# Patient Record
Sex: Female | Born: 1965 | Race: White | Hispanic: No | Marital: Married | Smoking: Former smoker
Health system: Southern US, Community
[De-identification: ages and names within clinical notes are randomized; demographics above are authoritative.]

## PROBLEM LIST (undated history)

## (undated) HISTORY — PX: WISDOM TOOTH EXTRACTION: SHX21

---

## 2003-11-24 ENCOUNTER — Other Ambulatory Visit: Admission: RE | Admit: 2003-11-24 | Discharge: 2003-11-24 | Payer: Self-pay | Admitting: Obstetrics and Gynecology

## 2005-01-03 ENCOUNTER — Ambulatory Visit (HOSPITAL_COMMUNITY): Admission: RE | Admit: 2005-01-03 | Discharge: 2005-01-03 | Payer: Self-pay | Admitting: Family Medicine

## 2005-02-03 ENCOUNTER — Other Ambulatory Visit: Admission: RE | Admit: 2005-02-03 | Discharge: 2005-02-03 | Payer: Self-pay | Admitting: Obstetrics and Gynecology

## 2005-09-05 ENCOUNTER — Encounter: Admission: RE | Admit: 2005-09-05 | Discharge: 2005-11-13 | Payer: Self-pay | Admitting: Family Medicine

## 2006-01-11 ENCOUNTER — Ambulatory Visit (HOSPITAL_COMMUNITY): Admission: RE | Admit: 2006-01-11 | Discharge: 2006-01-11 | Payer: Self-pay | Admitting: Obstetrics and Gynecology

## 2006-12-13 ENCOUNTER — Ambulatory Visit (HOSPITAL_COMMUNITY): Admission: RE | Admit: 2006-12-13 | Discharge: 2006-12-13 | Payer: Self-pay | Admitting: Family Medicine

## 2007-11-05 ENCOUNTER — Ambulatory Visit (HOSPITAL_COMMUNITY): Admission: RE | Admit: 2007-11-05 | Discharge: 2007-11-05 | Payer: Self-pay | Admitting: Obstetrics and Gynecology

## 2008-11-02 ENCOUNTER — Encounter: Admission: RE | Admit: 2008-11-02 | Discharge: 2008-12-25 | Payer: Self-pay | Admitting: Family Medicine

## 2008-11-26 ENCOUNTER — Ambulatory Visit (HOSPITAL_COMMUNITY): Admission: RE | Admit: 2008-11-26 | Discharge: 2008-11-26 | Payer: Self-pay | Admitting: Family Medicine

## 2009-11-29 ENCOUNTER — Ambulatory Visit (HOSPITAL_COMMUNITY): Admission: RE | Admit: 2009-11-29 | Discharge: 2009-11-29 | Payer: Self-pay | Admitting: Family Medicine

## 2010-01-18 ENCOUNTER — Encounter: Admission: RE | Admit: 2010-01-18 | Discharge: 2010-01-18 | Payer: Self-pay | Admitting: Surgery

## 2010-10-03 ENCOUNTER — Other Ambulatory Visit: Payer: Self-pay | Admitting: Endocrinology

## 2010-10-03 DIAGNOSIS — E041 Nontoxic single thyroid nodule: Secondary | ICD-10-CM

## 2010-10-08 IMAGING — US US SOFT TISSUE HEAD/NECK
1 series · 14 of 25 positions shown · non-contrast
Comparison: None.

CLINICAL DATA: Nodule

THYROID ULTRASOUND
TECHNIQUE: Ultrasound examination of the thyroid gland and adjacent
soft tissues was performed.

[Series 1: us soft tissue head/neck · 0.05mm/px · 14 of 40 slices shown]
[im 1/40]
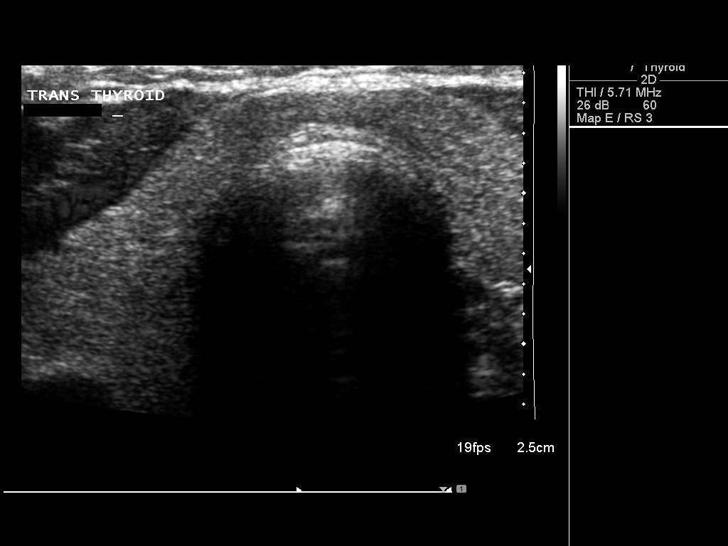
[im 4/40]
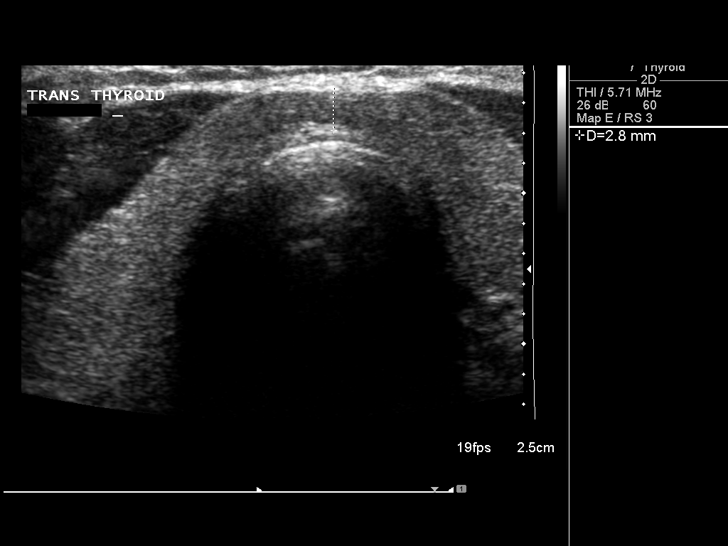
[im 7/40]
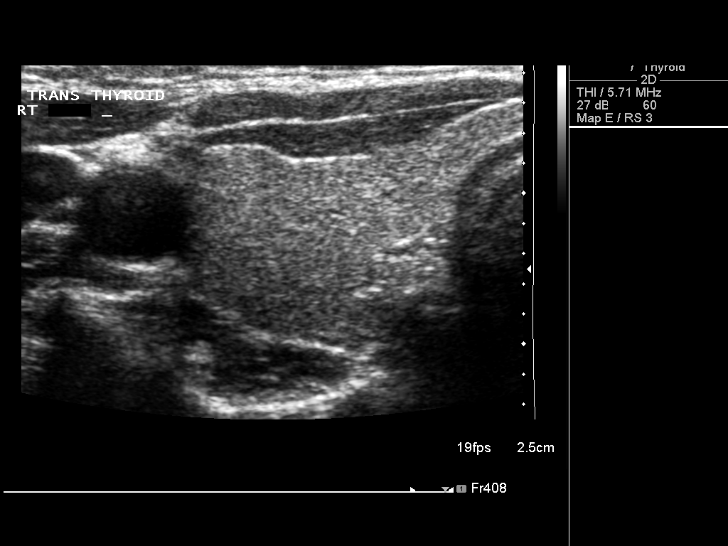
[im 10/40]
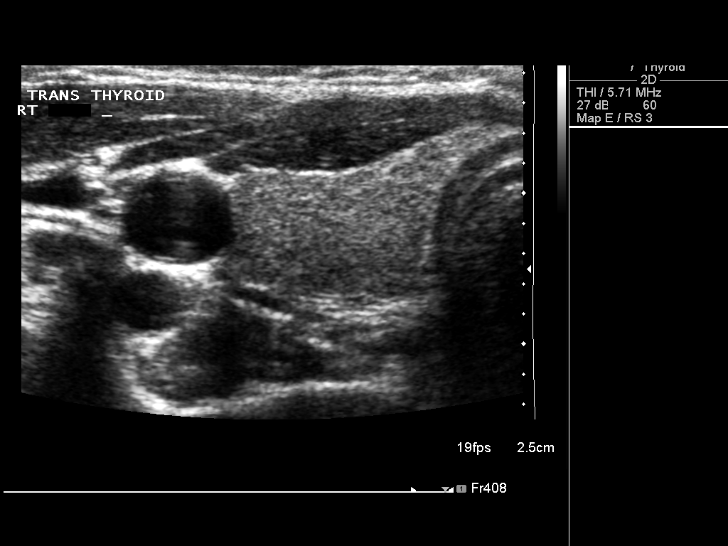
[im 14/40]
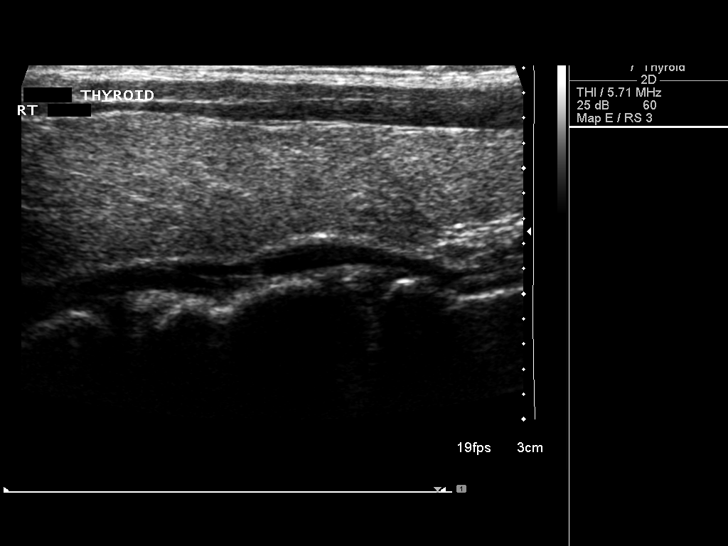
[im 15/40]
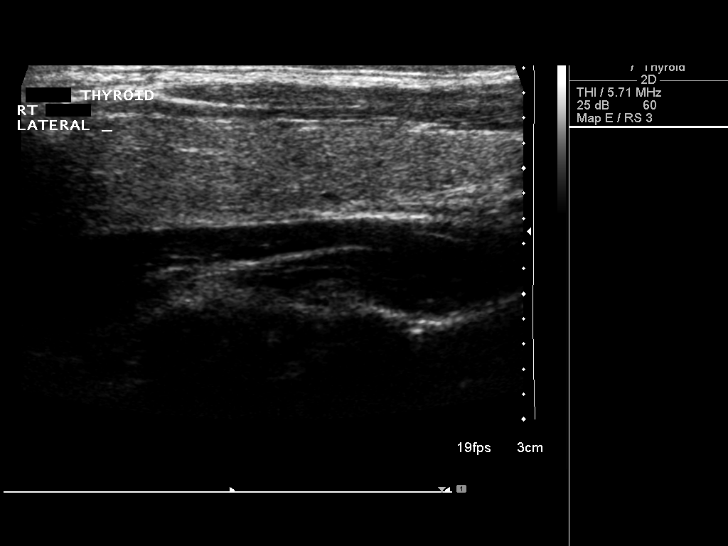
[im 18/40]
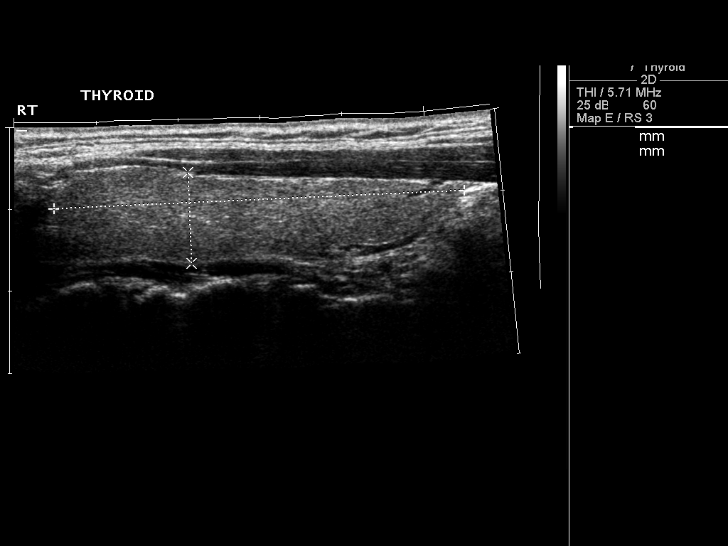
[im 22/40]
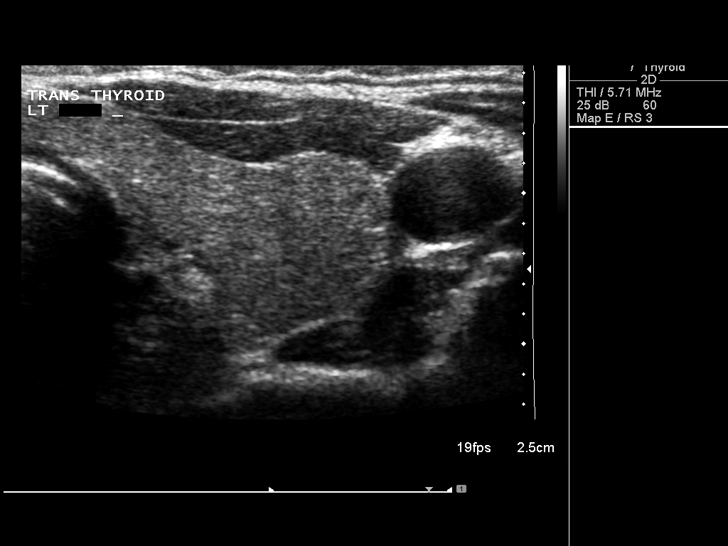
[im 25/40]
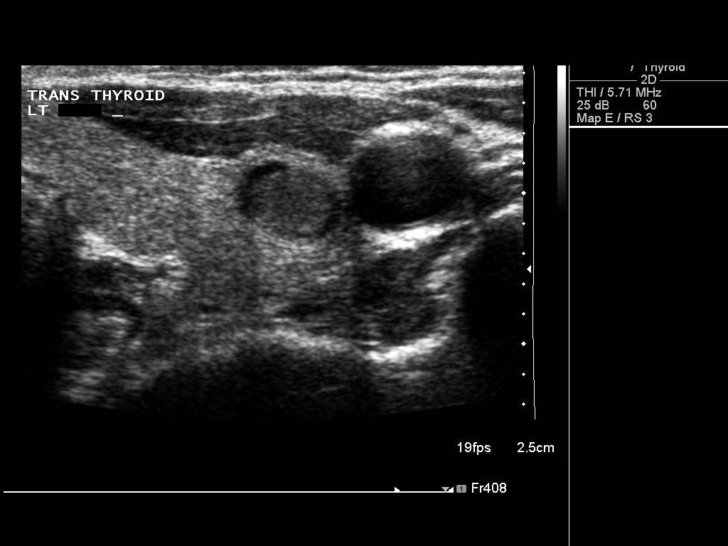
[im 27/40]
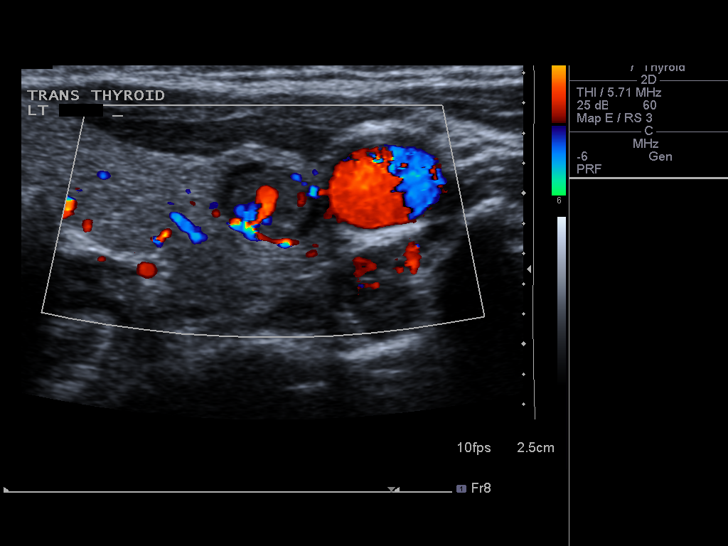
[im 30/40]
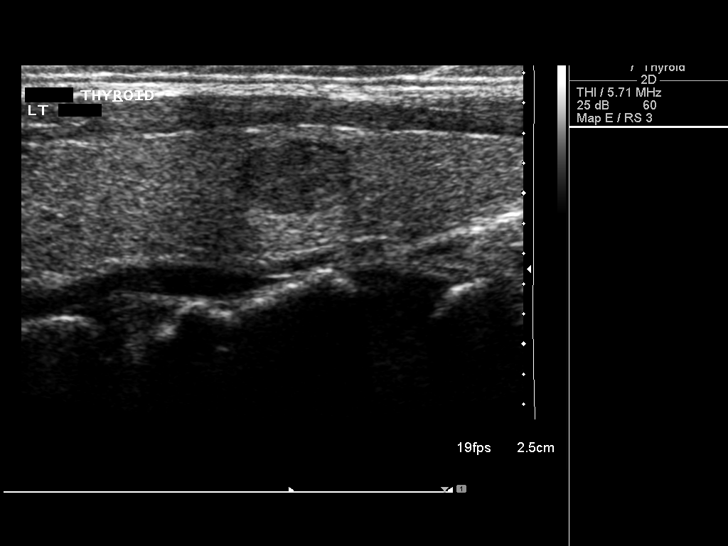
[im 33/40]
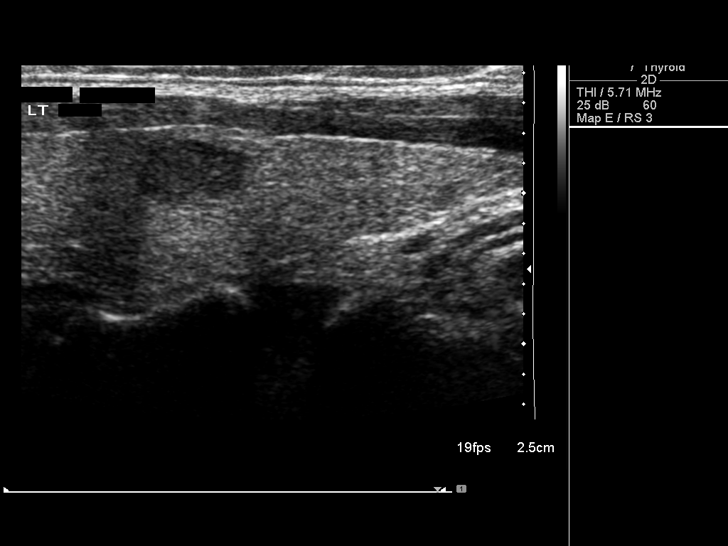
[im 36/40]
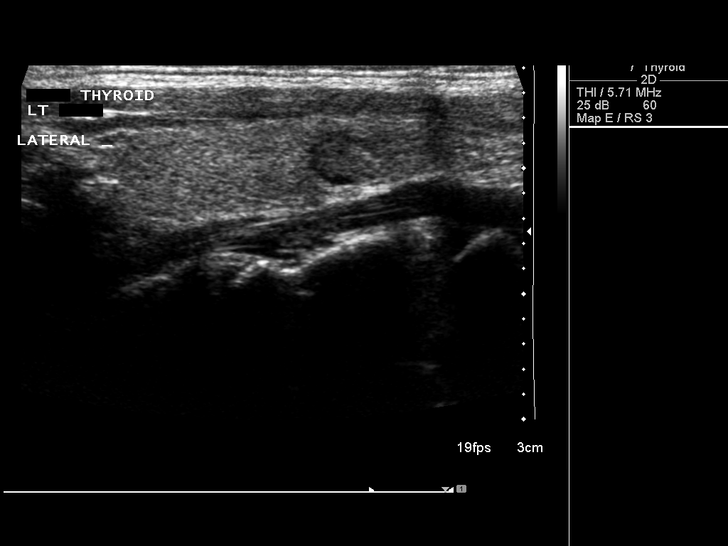
[im 40/40]
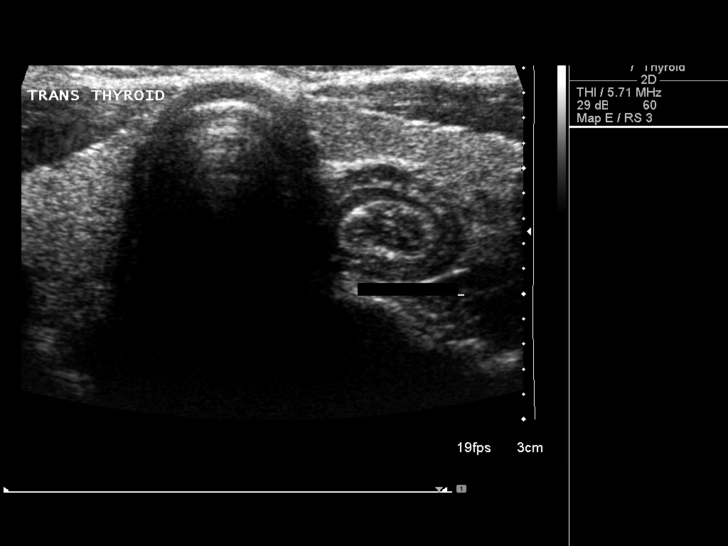

[14 of 25 positions shown; findings below may reference images not displayed]

FINDINGS: Right thyroid lobe:  5.0 x 1.1 x 1.8 cm.
Left thyroid lobe:  4.6 x 1.2 x 1.8 cm.
Isthmus:  3 mm in thickness.

Focal nodules:  Thyroid echotexture is homogeneous.  A single
nodule is noted in the interpolar region of the left lobe measuring
7 x 5 x 8 mm.

Lymphadenopathy:  None visualized.
IMPRESSION: 8 mm nodule in the left thyroid gland.  Follow-up ultrasound in 6
months is recommended to ensure stability.  Biopsy is not indicated
at this time.

## 2010-10-26 ENCOUNTER — Other Ambulatory Visit: Payer: Self-pay | Admitting: Internal Medicine

## 2010-10-26 DIAGNOSIS — E041 Nontoxic single thyroid nodule: Secondary | ICD-10-CM

## 2011-03-27 ENCOUNTER — Ambulatory Visit
Admission: RE | Admit: 2011-03-27 | Discharge: 2011-03-27 | Disposition: A | Discharge status: Home / Self Care | Payer: Managed Care, Other (non HMO) | Source: Ambulatory Visit | Attending: Endocrinology | Admitting: Endocrinology

## 2011-03-27 DIAGNOSIS — E041 Nontoxic single thyroid nodule: Secondary | ICD-10-CM

## 2011-04-06 ENCOUNTER — Other Ambulatory Visit (HOSPITAL_COMMUNITY): Payer: Self-pay | Admitting: Obstetrics and Gynecology

## 2011-04-06 DIAGNOSIS — Z1231 Encounter for screening mammogram for malignant neoplasm of breast: Secondary | ICD-10-CM

## 2011-05-11 ENCOUNTER — Ambulatory Visit (HOSPITAL_COMMUNITY): Payer: Managed Care, Other (non HMO)

## 2011-11-03 ENCOUNTER — Ambulatory Visit (HOSPITAL_COMMUNITY)
Admission: RE | Admit: 2011-11-03 | Discharge: 2011-11-03 | Disposition: A | Discharge status: Home / Self Care | Payer: BC Managed Care – PPO | Source: Ambulatory Visit | Attending: Obstetrics and Gynecology | Admitting: Obstetrics and Gynecology

## 2011-11-03 DIAGNOSIS — Z1231 Encounter for screening mammogram for malignant neoplasm of breast: Secondary | ICD-10-CM | POA: Insufficient documentation

## 2012-04-17 HISTORY — PX: GUM SURGERY: SHX658

## 2012-06-24 ENCOUNTER — Other Ambulatory Visit: Payer: Self-pay | Admitting: Endocrinology

## 2012-06-24 DIAGNOSIS — E041 Nontoxic single thyroid nodule: Secondary | ICD-10-CM

## 2012-07-01 ENCOUNTER — Other Ambulatory Visit: Payer: BC Managed Care – PPO

## 2012-07-03 ENCOUNTER — Ambulatory Visit
Admission: RE | Admit: 2012-07-03 | Discharge: 2012-07-03 | Disposition: A | Discharge status: Home / Self Care | Payer: Self-pay | Source: Ambulatory Visit | Attending: Endocrinology | Admitting: Endocrinology

## 2013-01-01 ENCOUNTER — Other Ambulatory Visit (HOSPITAL_COMMUNITY): Payer: Self-pay | Admitting: Obstetrics and Gynecology

## 2013-01-01 DIAGNOSIS — Z1231 Encounter for screening mammogram for malignant neoplasm of breast: Secondary | ICD-10-CM

## 2013-01-21 ENCOUNTER — Ambulatory Visit (HOSPITAL_COMMUNITY)
Admission: RE | Admit: 2013-01-21 | Discharge: 2013-01-21 | Disposition: A | Discharge status: Home / Self Care | Payer: BC Managed Care – PPO | Source: Ambulatory Visit | Attending: Obstetrics and Gynecology | Admitting: Obstetrics and Gynecology

## 2013-01-21 ENCOUNTER — Other Ambulatory Visit (HOSPITAL_COMMUNITY): Payer: Self-pay | Admitting: Obstetrics and Gynecology

## 2013-01-21 DIAGNOSIS — Z1231 Encounter for screening mammogram for malignant neoplasm of breast: Secondary | ICD-10-CM

## 2013-07-30 ENCOUNTER — Other Ambulatory Visit: Payer: Self-pay | Admitting: Endocrinology

## 2013-07-30 DIAGNOSIS — E049 Nontoxic goiter, unspecified: Secondary | ICD-10-CM

## 2013-07-30 DIAGNOSIS — E041 Nontoxic single thyroid nodule: Secondary | ICD-10-CM

## 2013-07-31 ENCOUNTER — Ambulatory Visit
Admission: RE | Admit: 2013-07-31 | Discharge: 2013-07-31 | Disposition: A | Discharge status: Home / Self Care | Payer: BC Managed Care – PPO | Source: Ambulatory Visit | Attending: Endocrinology | Admitting: Endocrinology

## 2013-07-31 DIAGNOSIS — E049 Nontoxic goiter, unspecified: Secondary | ICD-10-CM

## 2013-08-01 ENCOUNTER — Other Ambulatory Visit: Payer: BC Managed Care – PPO

## 2013-10-16 ENCOUNTER — Other Ambulatory Visit: Payer: Self-pay | Admitting: Endocrinology

## 2013-10-16 DIAGNOSIS — E049 Nontoxic goiter, unspecified: Secondary | ICD-10-CM

## 2014-07-27 ENCOUNTER — Other Ambulatory Visit: Payer: BC Managed Care – PPO

## 2014-07-28 ENCOUNTER — Ambulatory Visit
Admission: RE | Admit: 2014-07-28 | Discharge: 2014-07-28 | Disposition: A | Discharge status: Home / Self Care | Payer: BLUE CROSS/BLUE SHIELD | Source: Ambulatory Visit | Attending: Endocrinology | Admitting: Endocrinology

## 2014-07-28 DIAGNOSIS — E049 Nontoxic goiter, unspecified: Secondary | ICD-10-CM

## 2014-10-01 ENCOUNTER — Ambulatory Visit (INDEPENDENT_AMBULATORY_CARE_PROVIDER_SITE_OTHER): Payer: BLUE CROSS/BLUE SHIELD

## 2014-10-01 ENCOUNTER — Ambulatory Visit (INDEPENDENT_AMBULATORY_CARE_PROVIDER_SITE_OTHER): Payer: BLUE CROSS/BLUE SHIELD | Admitting: Podiatry

## 2014-10-01 DIAGNOSIS — G5762 Lesion of plantar nerve, left lower limb: Secondary | ICD-10-CM

## 2014-10-01 DIAGNOSIS — M79673 Pain in unspecified foot: Secondary | ICD-10-CM | POA: Diagnosis not present

## 2014-10-01 DIAGNOSIS — B353 Tinea pedis: Secondary | ICD-10-CM | POA: Diagnosis not present

## 2014-10-01 DIAGNOSIS — M2010 Hallux valgus (acquired), unspecified foot: Secondary | ICD-10-CM

## 2014-10-01 NOTE — Progress Notes (Signed)
   Subjective:    Patient ID: Katie Carney, female    DOB: [birth year], 49 y.o.   MRN: [id number]  HPI  PT STATED LT FOOT IS BEEN PAINFUL FOR LONG-TERM. THE FOOT IS GETTING WORSE ESPECIALLY WHEN FLEXING OPT HIGH HEEL IS PAINFUL. TRIED NO TREATMENT.  ALSO, BOTTOM RT FOOT HAVE DRY SKIN.  Review of Systems  All other systems reviewed and are negative.      Objective:   Physical Exam: I have reviewed her past medical history medications allergies surgery social history and review of systems. Pulses are strongly palpable bilateral. Neurologic sensorium is intact per Semmes-Weinstein monofilament. Deep tendon reflexes are intact muscle strength +5 over 5 dorsiflexion plantar flexors and inverters and everters all into the musculature is intact. She does have a palpable Mulder's click to the third interdigital space of the left foot. This is associated with a neuroma. She has moderate to severe bunion deformities bilaterally as well as calluses to the plantar aspect of the bilateral foot radiographs confirm hallux valgus deformity and early dislocation of the first metatarsophalangeal joints. She also has what appears to be interdigital tinea pedis between the fourth and fifth digits of the right foot.        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 Athlete's foot right foot.  Plan: We discussed the etiology and pathology conservative versus surgical therapies. I suggested an injection of cortisone which she declined. I also suggested placing spacers between the toes which she will do. Also suggested surgical intervention of the bunions which she declined. I also suggested over-the-counter Lamisil AT. I will follow-up with her as needed.

## 2014-11-05 ENCOUNTER — Other Ambulatory Visit (HOSPITAL_COMMUNITY): Payer: Self-pay | Admitting: Obstetrics and Gynecology

## 2014-11-05 DIAGNOSIS — Z1231 Encounter for screening mammogram for malignant neoplasm of breast: Secondary | ICD-10-CM

## 2014-11-25 ENCOUNTER — Ambulatory Visit (HOSPITAL_COMMUNITY)
Admission: RE | Admit: 2014-11-25 | Discharge: 2014-11-25 | Disposition: A | Discharge status: Home / Self Care | Payer: BLUE CROSS/BLUE SHIELD | Source: Ambulatory Visit | Attending: Obstetrics and Gynecology | Admitting: Obstetrics and Gynecology

## 2014-11-25 ENCOUNTER — Other Ambulatory Visit (HOSPITAL_COMMUNITY): Payer: Self-pay | Admitting: Obstetrics and Gynecology

## 2014-11-25 DIAGNOSIS — Z1231 Encounter for screening mammogram for malignant neoplasm of breast: Secondary | ICD-10-CM

## 2014-12-11 ENCOUNTER — Ambulatory Visit (INDEPENDENT_AMBULATORY_CARE_PROVIDER_SITE_OTHER): Payer: BLUE CROSS/BLUE SHIELD | Admitting: Neurology

## 2014-12-11 ENCOUNTER — Encounter: Payer: Self-pay | Admitting: Neurology

## 2014-12-11 VITALS — BP 111/78 | HR 72 | Ht 64.5 in | Wt 179.2 lb

## 2014-12-11 DIAGNOSIS — Z658 Other specified problems related to psychosocial circumstances: Secondary | ICD-10-CM

## 2014-12-11 DIAGNOSIS — R413 Other amnesia: Secondary | ICD-10-CM

## 2014-12-11 DIAGNOSIS — F439 Reaction to severe stress, unspecified: Secondary | ICD-10-CM

## 2014-12-11 DIAGNOSIS — F411 Generalized anxiety disorder: Secondary | ICD-10-CM | POA: Diagnosis not present

## 2014-12-11 NOTE — Progress Notes (Signed)
[facility]    Provider:  Dr Lucia Gaskins Referring Provider: Julio Sicks, NP Primary Care Physician:  No PCP Per Patient  CC:  Memory loss  HPI:  Katie Carney is a 49 y.o. female here as a referral from Dr. Lyda Jester for memory loss. No significant past medical history. Started in the last year. She can be talking with mother on the phone and she will forget the conversation after she has hung up. Mom will tell her about an appointment and she will forget. She has to write everything down. 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 She is very detailed usually, this recent memory change is upsetting. She is still functioning at work, Therapist, nutritional more. No accidents in the home, she pays bills late sometimes now because she is busy and it slips her mind and she needs to do them on the weekend. She has to read and re-read things to comprehend and she has a masters degree and is very high functioning. She is under a lot of stress. She is teary in the office. Lots of stress at home and at work.   FHx of alzheimers in aunts. Doesn't think it was early onset.  Review of Systems: Patient complains of symptoms per HPI as well as the following symptoms: weight gain, spinning sensation, blurred vision, easy bruising, cramps, headache. Pertinent negatives per HPI. All others negative.    Social History   Social History  . Marital Status: Married    Spouse Name: Roe Coombs  . Number of Children: 0  . Years of Education: Masters   Occupational History  . [employer]    Social History Main Topics  . Smoking status: Former Games developer  . Smokeless tobacco: Not on file  . Alcohol Use: 0.0 oz/week    0 Standard drinks or equivalent per week     Comment: 5-10 drinks/month   . Drug Use: No  . Sexual Activity: Not on file   Other Topics Concern  . Not on file   Social History Narrative   Lives at home with husband and dogs/cats.     Caffeine use: Drinks  organic coffee in the morning    History reviewed. No pertinent family history.  History reviewed. No pertinent past medical history.  Past Surgical History  Procedure Laterality Date  . Wisdom tooth extraction    . Gum surgery  2014    Current Outpatient Prescriptions  Medication Sig Dispense Refill  . DHA-EPA-Vitamin E [id number] MG-MG-UNIT CAPS Take by mouth.    . levocetirizine (XYZAL) 5 MG tablet Take 5 mg by mouth as needed.     . Multiple Vitamin (MULTIVITAMIN) tablet Take 1 tablet by mouth.    . NON FORMULARY Progesterone HRT 20%    . Olopatadine HCl (PATADAY) 0.2 % SOLN 1 drop as needed.     . thyroid (ARMOUR THYROID) 30 MG tablet Take 45 mg by mouth.     Marland Kitchen UNABLE TO FIND      No current facility-administered medications for this visit.    Allergies as of 12/11/2014 - Review Complete 12/11/2014  Allergen Reaction Noted  . Penicillins  10/01/2014    Vitals: BP 111/78 mmHg  Pulse 72  Ht 5' 4.5" (1.638 m)  Wt 179 lb 3.2 oz (81.285 kg)  BMI 30.30 kg/m2 Last Weight:  Wt Readings from Last 1 Encounters:  12/11/14 179 lb 3.2 oz (81.285 kg)   Last Height:   Ht Readings  from Last 1 Encounters:  12/11/14 5' 4.5" (1.638 m)    Physical exam: Exam: Gen: NAD, conversant, well nourised, well groomed                     CV: RRR, no MRG. No Carotid Bruits. No peripheral edema, warm, nontender Eyes: Conjunctivae clear without exudates or hemorrhage  Neuro: Detailed Neurologic Exam  Speech:    Speech is normal; fluent and spontaneous with normal comprehension.  Cognition: MoCA 30/30    The patient is oriented to person, place, and time;     recent and remote memory intact;     language fluent;     normal attention, concentration,     fund of knowledge Cranial Nerves:    The pupils are equal, round, and reactive to light. The fundi are normal and spontaneous venous pulsations are present. Visual fields are full to finger confrontation. Extraocular movements are  intact. Trigeminal sensation is intact and the muscles of mastication are normal. The face is symmetric. The palate elevates in the midline. Hearing intact. Voice is normal. Shoulder shrug is normal. The tongue has normal motion without fasciculations.   Coordination:    Normal finger to nose and heel to shin. Normal rapid alternating movements.   Gait:    Heel-toe and tandem gait are normal.   Motor Observation:    No asymmetry, no atrophy, and no involuntary movements noted. Tone:    Normal muscle tone.    Posture:    Posture is normal. normal erect    Strength:    Strength is V/V in the upper and lower limbs.      Sensation: intact to LT     Reflex Exam:  DTR's:    Deep tendon reflexes in the upper and lower extremities are normal bilaterally.   Toes:    The toes are downgoing bilaterally.   Clonus:    Clonus is absent.      Assessment/Plan:  49 year old very nice female here for evaluation of cognitive complaints in the setting of significant stress and anxiety. Neurologic exam is normal. Montreal Cognitive Assessment score is 30 over 30. Had a long discussion with patient that anxiety and stress can definitely cause her symptoms. Tried to reassure patient. We'll order an MRI of the brain to ensure that there is no intracranial etiology for her symptoms. We'll also order B12 panel. Per patient, thyroid has been normal, doesn't think she needs RPR and HIV. Highly encouraged patient to seek the help of a therapist to try and help with the significant stress and anxiety. Did discuss formal neuro psychological testing if needed in the future.  Naomie Dean, MD  [facility] [address] [place], [place] [address]  Phone [phone] Fax [phone]

## 2014-12-11 NOTE — Patient Instructions (Signed)
Overall you are doing fairly well but I do want to suggest a few things today:   Remember to drink plenty of fluid, eat healthy meals and do not skip any meals. Try to eat protein with a every meal and eat a healthy snack such as fruit or nuts in between meals. Try to keep a regular sleep-wake schedule and try to exercise daily, particularly in the form of walking, 20-30 minutes a day, if you can.   As far as diagnostic testing: MRi of the brain, labs  My clinical assistant and will answer any of your questions and relay your messages to me and also relay most of my messages to you.   Our phone number is [phone]. We also have an after hours call service for urgent matters and there is a physician on-call for urgent questions. For any emergencies you know to call 911 or go to the nearest emergency room

## 2014-12-14 ENCOUNTER — Other Ambulatory Visit: Payer: Self-pay | Admitting: Endocrinology

## 2014-12-14 ENCOUNTER — Encounter: Payer: Self-pay | Admitting: Neurology

## 2014-12-14 DIAGNOSIS — E049 Nontoxic goiter, unspecified: Secondary | ICD-10-CM

## 2014-12-14 DIAGNOSIS — F439 Reaction to severe stress, unspecified: Secondary | ICD-10-CM | POA: Insufficient documentation

## 2014-12-14 DIAGNOSIS — F411 Generalized anxiety disorder: Secondary | ICD-10-CM | POA: Insufficient documentation

## 2014-12-15 ENCOUNTER — Telehealth: Payer: Self-pay | Admitting: *Deleted

## 2014-12-15 NOTE — Telephone Encounter (Signed)
Left VM for pt to call about results. Okay to inform pt B12 and folate levels were normal. Gave [facility] phone number and office hours.

## 2014-12-15 NOTE — Telephone Encounter (Signed)
-----   Message from Anson Fret, MD sent at 12/15/2014  1:15 PM EDT ----- Please let patient know her b12 and folate was normal thanks

## 2014-12-16 LAB — B12 AND FOLATE PANEL
Folate: 16 ng/mL (ref >3.0)
VITAMIN B 12: 844 pg/mL (ref 211–946)

## 2014-12-16 LAB — METHYLMALONIC ACID, SERUM: Methylmalonic Acid: 189 nmol/L (ref 0–378)

## 2014-12-17 NOTE — Telephone Encounter (Signed)
LVM for pt to call regarding lab results. Gave [facility] phone number and office hours. Okay to inform pt B12 and folate levels are normal.

## 2014-12-24 NOTE — Telephone Encounter (Signed)
LVM for pt to call back about results. Gave [facility] phone number and office hours. Okay to inform pt about normal B12 and folate levels. Thank you.

## 2014-12-24 NOTE — Telephone Encounter (Signed)
LVM to let pt know I was calling about lab results. I have left two message on home phone. Will try her home phone again. Gave [facility] phone number.

## 2014-12-28 ENCOUNTER — Encounter: Payer: Self-pay | Admitting: *Deleted

## 2014-12-28 NOTE — Telephone Encounter (Signed)
Patient returned call regarding lab results. Advised pt of normal B12 and folate levels. Patient wanted to know if these were the only labs that were done? And would like a copy of the results. Patient also wanted to let nurse know that she has been out of town on vacation and just got back today.

## 2014-12-28 NOTE — Telephone Encounter (Signed)
Patient would prefer to be called back at her work number [phone]

## 2014-12-28 NOTE — Telephone Encounter (Signed)
Called pt back. Told her B12, folate, and methylmalonic acid levels and normal reference range. Explained she can sign up for my-chart and see these values as well. She can also contact our medical records department to fill out medical release form to receive a copy of these results. She verbalized understanding. She is still scheduled for MRI.

## 2014-12-28 NOTE — Telephone Encounter (Signed)
Sent pt letter regarding lab results.  

## 2015-01-05 ENCOUNTER — Telehealth: Payer: Self-pay | Admitting: Neurology

## 2015-01-05 NOTE — Telephone Encounter (Signed)
Thank you :)

## 2015-01-05 NOTE — Telephone Encounter (Signed)
Patient called to cancel her MRI for 01/06/15. She states that right now it is not a good time for her due to finances and will call the beginning of next year to r/s. If you need to contact the patient, she can be reached @ [phone]

## 2015-01-06 ENCOUNTER — Other Ambulatory Visit: Payer: BLUE CROSS/BLUE SHIELD

## 2015-09-29 ENCOUNTER — Other Ambulatory Visit: Payer: BLUE CROSS/BLUE SHIELD

## 2015-10-08 ENCOUNTER — Other Ambulatory Visit: Payer: BLUE CROSS/BLUE SHIELD

## 2015-10-12 ENCOUNTER — Ambulatory Visit
Admission: RE | Admit: 2015-10-12 | Discharge: 2015-10-12 | Disposition: A | Discharge status: Home / Self Care | Payer: BLUE CROSS/BLUE SHIELD | Source: Ambulatory Visit | Attending: Endocrinology | Admitting: Endocrinology

## 2015-10-12 DIAGNOSIS — E049 Nontoxic goiter, unspecified: Secondary | ICD-10-CM

## 2015-12-09 ENCOUNTER — Other Ambulatory Visit: Payer: BLUE CROSS/BLUE SHIELD

## 2016-06-13 ENCOUNTER — Other Ambulatory Visit: Payer: Self-pay | Admitting: Endocrinology

## 2016-06-13 DIAGNOSIS — E049 Nontoxic goiter, unspecified: Secondary | ICD-10-CM

## 2016-09-28 ENCOUNTER — Other Ambulatory Visit: Payer: Self-pay | Admitting: Obstetrics & Gynecology

## 2016-09-28 DIAGNOSIS — Z1231 Encounter for screening mammogram for malignant neoplasm of breast: Secondary | ICD-10-CM

## 2016-10-11 ENCOUNTER — Other Ambulatory Visit: Payer: BLUE CROSS/BLUE SHIELD

## 2016-10-17 ENCOUNTER — Ambulatory Visit
Admission: RE | Admit: 2016-10-17 | Discharge: 2016-10-17 | Disposition: A | Discharge status: Home / Self Care | Payer: BLUE CROSS/BLUE SHIELD | Source: Ambulatory Visit | Attending: Endocrinology | Admitting: Endocrinology

## 2016-10-17 DIAGNOSIS — E049 Nontoxic goiter, unspecified: Secondary | ICD-10-CM

## 2016-10-24 ENCOUNTER — Ambulatory Visit
Admission: RE | Admit: 2016-10-24 | Discharge: 2016-10-24 | Disposition: A | Discharge status: Home / Self Care | Payer: BLUE CROSS/BLUE SHIELD | Source: Ambulatory Visit | Attending: Obstetrics & Gynecology | Admitting: Obstetrics & Gynecology

## 2016-10-24 DIAGNOSIS — Z1231 Encounter for screening mammogram for malignant neoplasm of breast: Secondary | ICD-10-CM

## 2016-10-26 ENCOUNTER — Other Ambulatory Visit: Payer: Self-pay | Admitting: Obstetrics & Gynecology

## 2016-10-26 DIAGNOSIS — R928 Other abnormal and inconclusive findings on diagnostic imaging of breast: Secondary | ICD-10-CM

## 2016-11-01 ENCOUNTER — Ambulatory Visit
Admission: RE | Admit: 2016-11-01 | Discharge: 2016-11-01 | Disposition: A | Discharge status: Home / Self Care | Payer: BLUE CROSS/BLUE SHIELD | Source: Ambulatory Visit | Attending: Obstetrics & Gynecology | Admitting: Obstetrics & Gynecology

## 2016-11-01 ENCOUNTER — Ambulatory Visit: Payer: BLUE CROSS/BLUE SHIELD

## 2016-11-01 DIAGNOSIS — R928 Other abnormal and inconclusive findings on diagnostic imaging of breast: Secondary | ICD-10-CM

## 2017-07-14 IMAGING — MG 2D DIGITAL SCREENING BILATERAL MAMMOGRAM WITH CAD AND ADJUNCT TO
8 of 12 series · 8 of 28 positions shown · non-contrast
Comparison: Previous exam(s).

CLINICAL DATA: Screening.

EXAM:
2D DIGITAL SCREENING BILATERAL MAMMOGRAM WITH CAD AND ADJUNCT TOMO

[L CC]
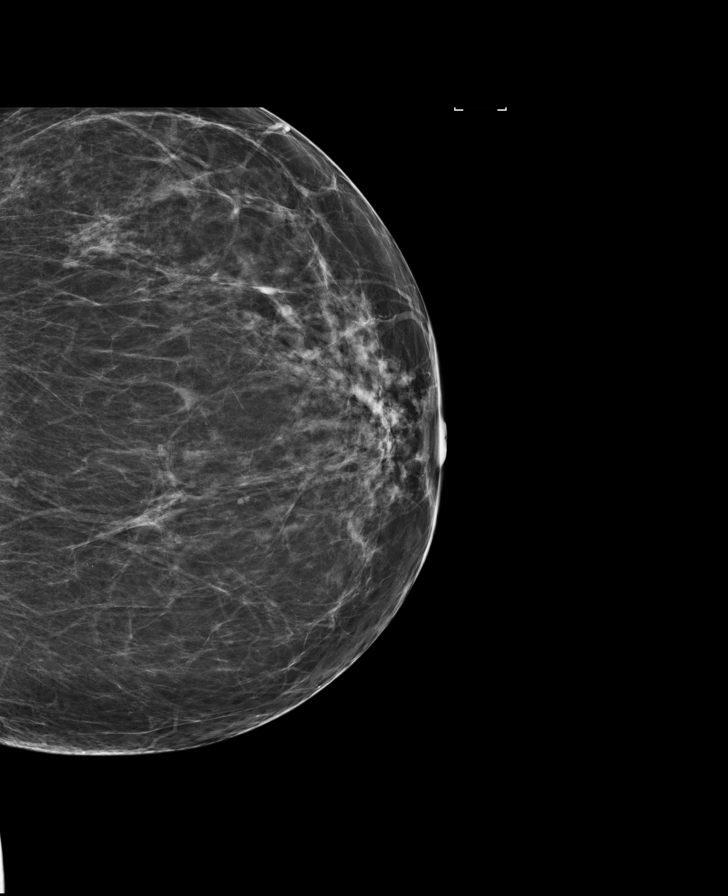

[R MLO]
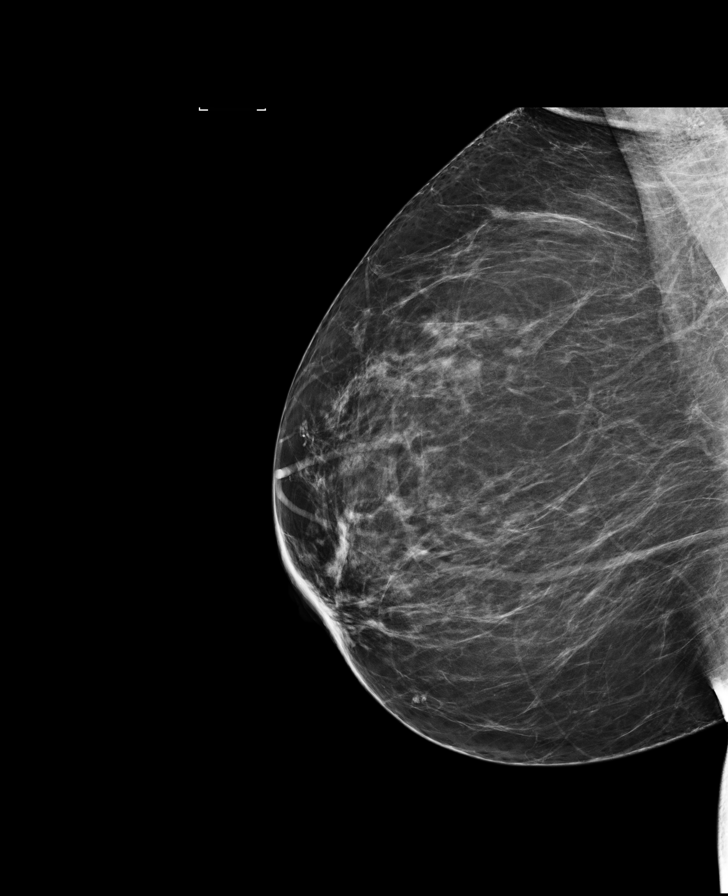

[R MLO synth-2D]
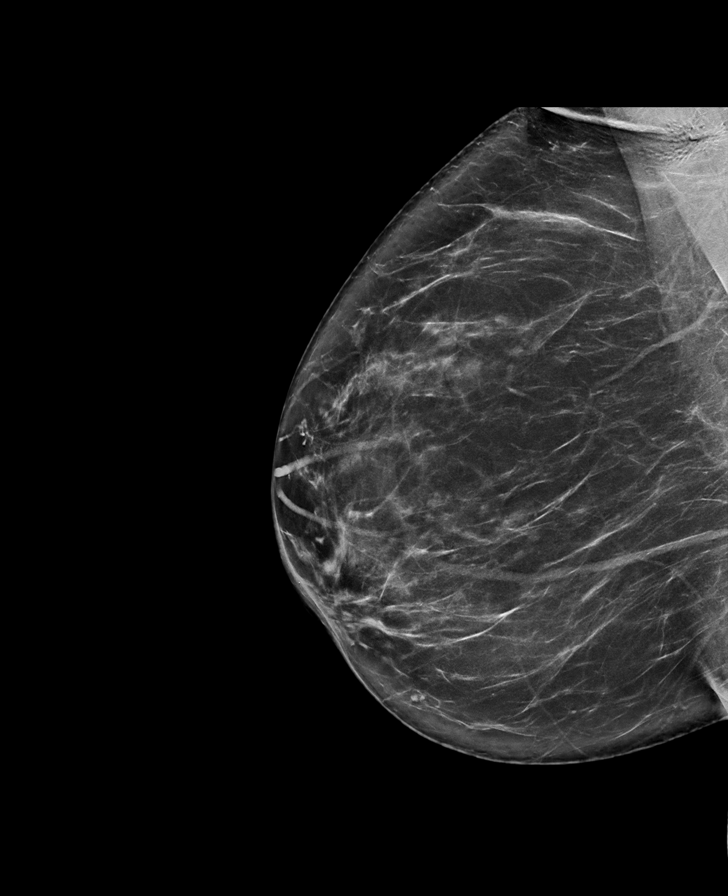

[R CC]
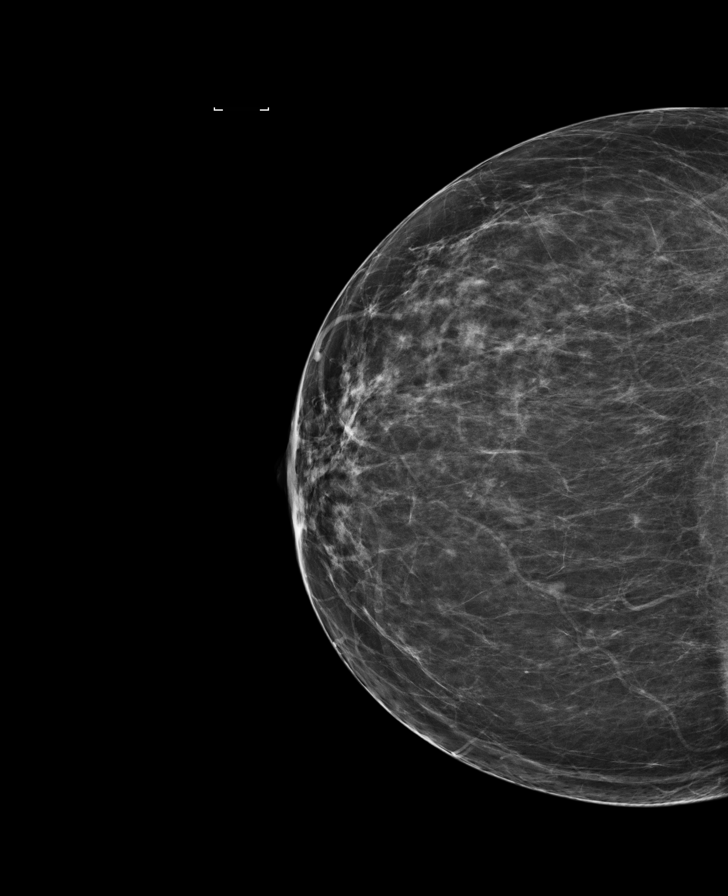

[L CC synth-2D]
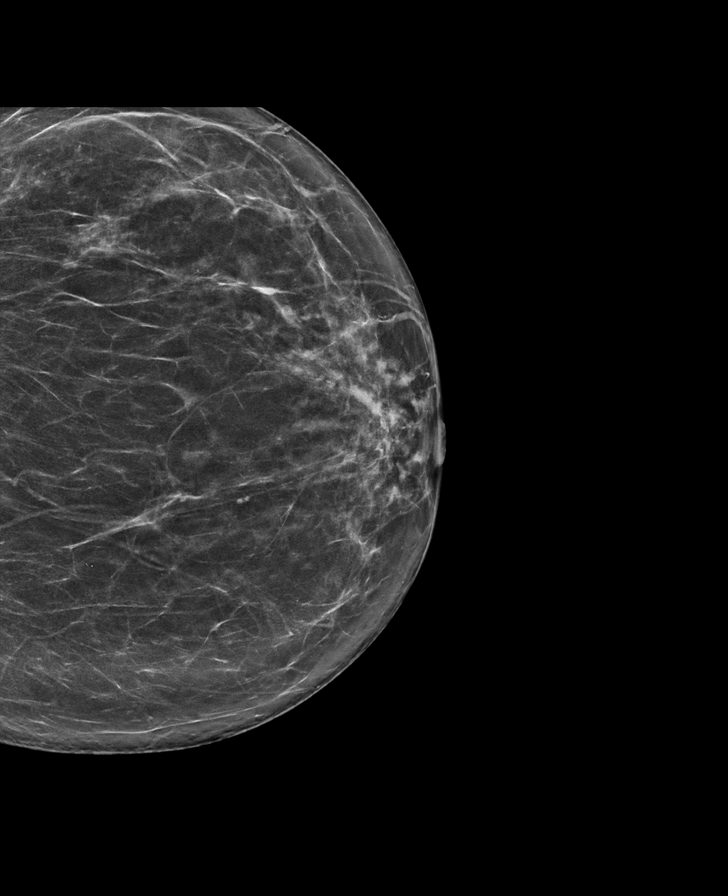

[R CC synth-2D]
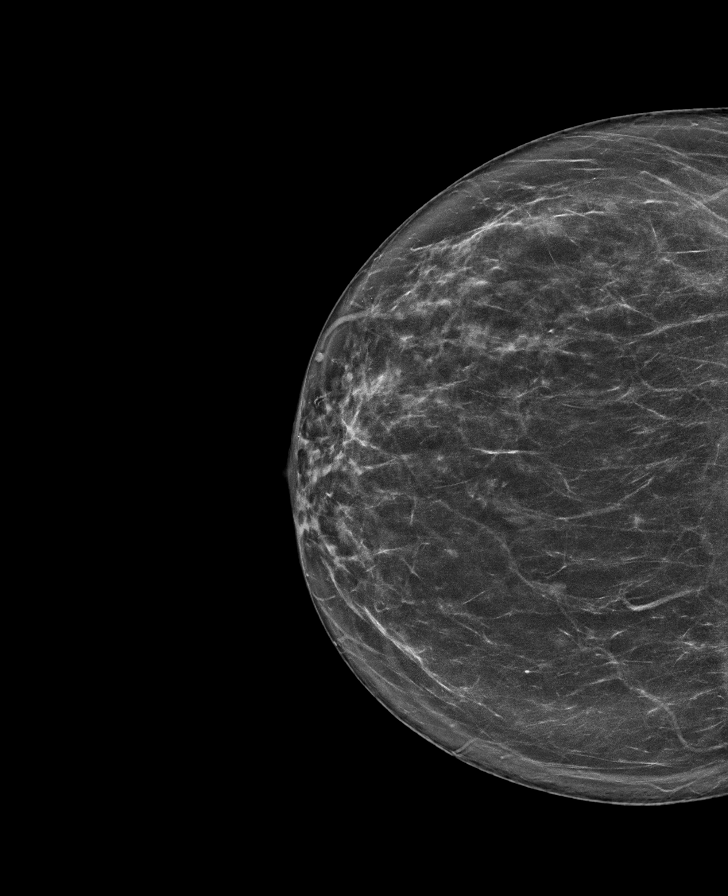

[L MLO synth-2D]
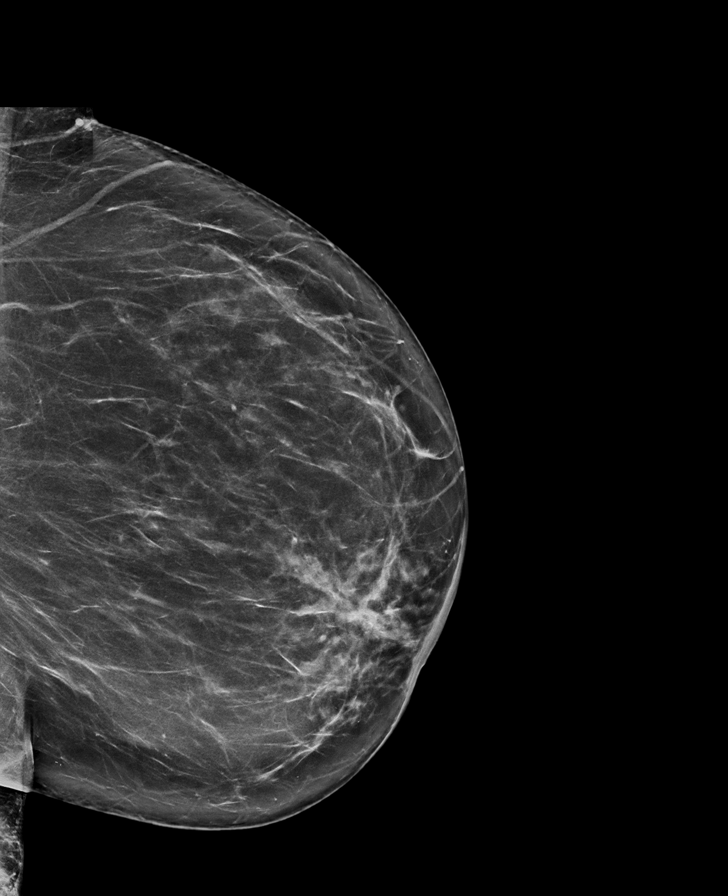

[L MLO]
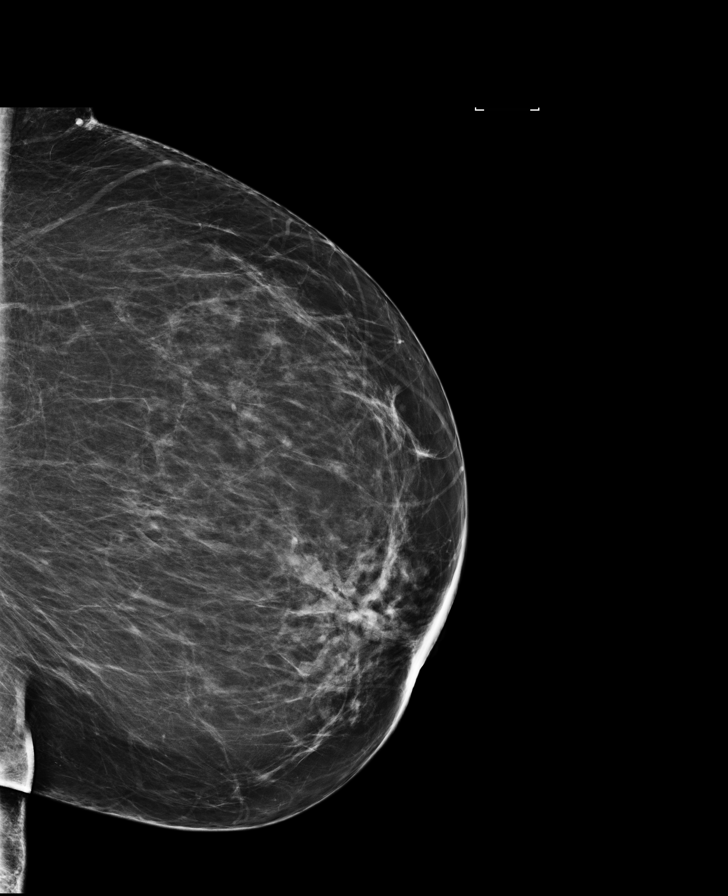

[8 of 28 positions shown; findings below may reference images not displayed]

ACR Breast Density Category b: There are scattered areas of
fibroglandular density.
FINDINGS: In the right breast, a possible mass warrants further evaluation. In
the left breast, no findings suspicious for malignancy. Images were
processed with CAD.
IMPRESSION: Further evaluation is suggested for possible mass in the right
breast.

RECOMMENDATION:
Diagnostic mammogram and possibly ultrasound of the right breast.
(Code:[id number])

The patient will be contacted regarding the findings, and additional
imaging will be scheduled.

BI-RADS CATEGORY  0: Incomplete. Need additional imaging evaluation
and/or prior mammograms for comparison.

## 2017-07-22 IMAGING — MG 2D DIGITAL DIAGNOSTIC UNILATERAL RIGHT MAMMOGRAM WITH CAD AND AD
6 series · 6 of 14 positions shown · non-contrast
Comparison: Previous exams including recent screening mammogram
dated 10/24/2016.

CLINICAL DATA: Patient returns today to evaluate a possible right
breast mass questioned on recent screening mammogram.

EXAM:
2D DIGITAL DIAGNOSTIC UNILATERAL RIGHT MAMMOGRAM WITH CAD AND
ADJUNCT TOMO

[R CC synth-2D]
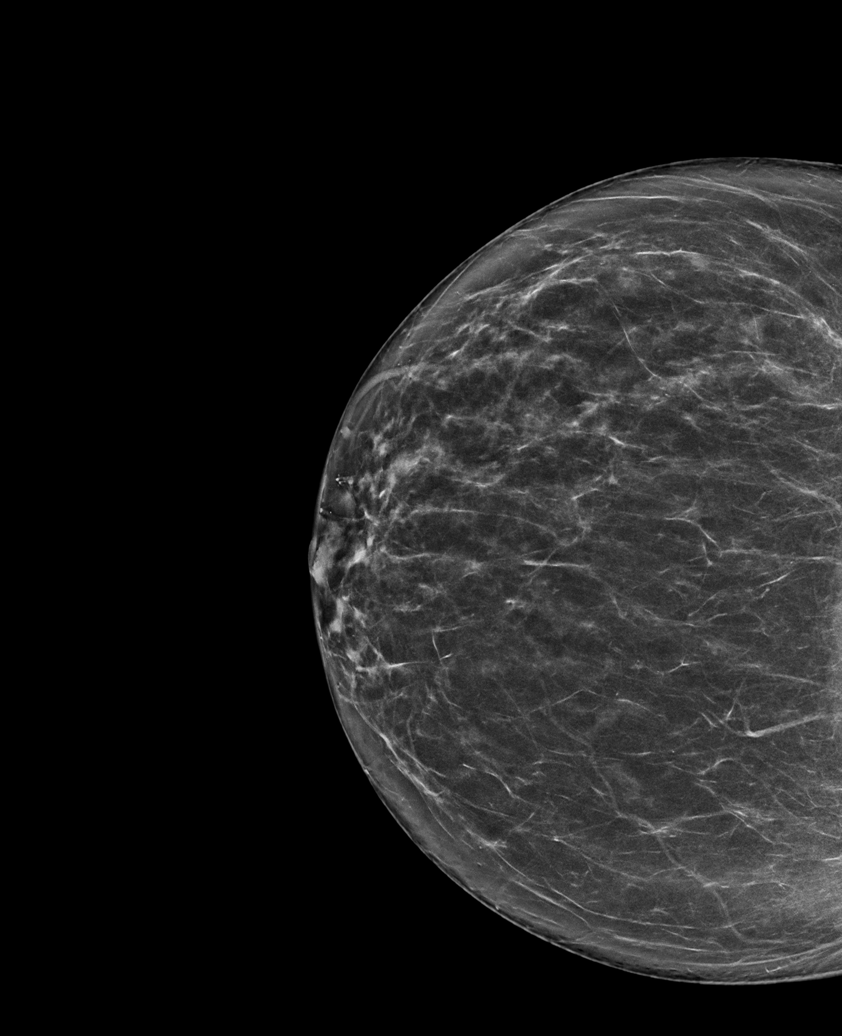

[R CC]
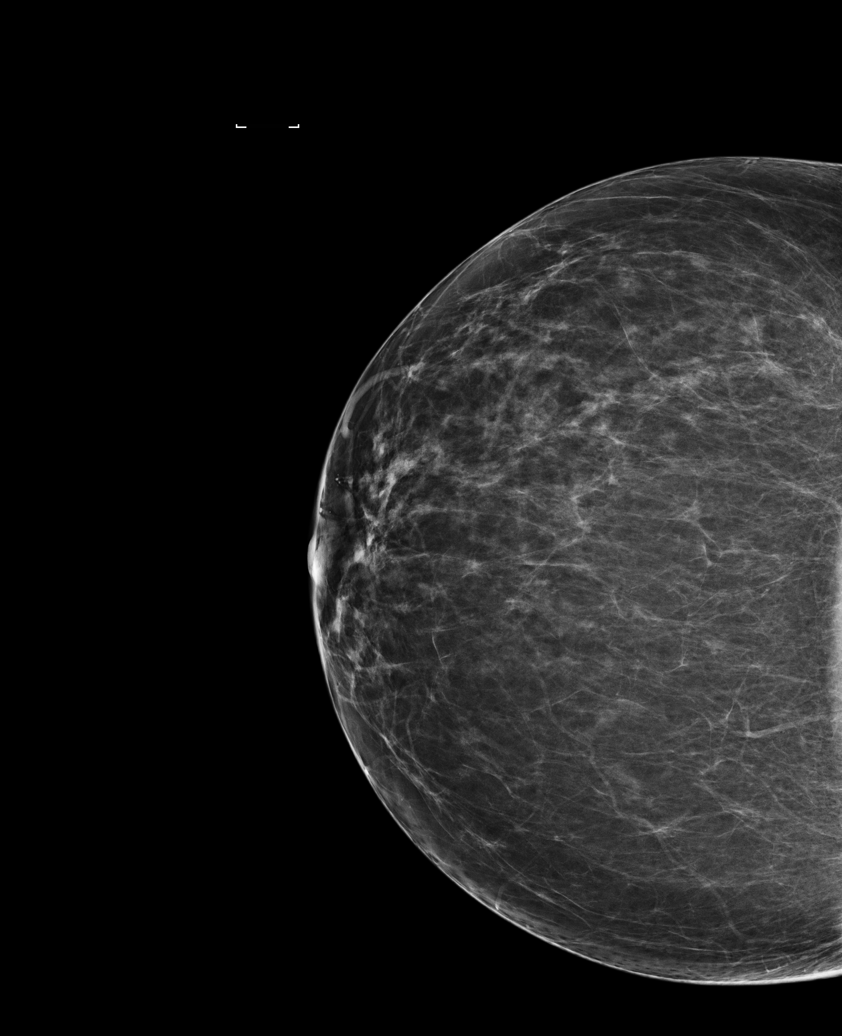

[R MLO synth-2D]
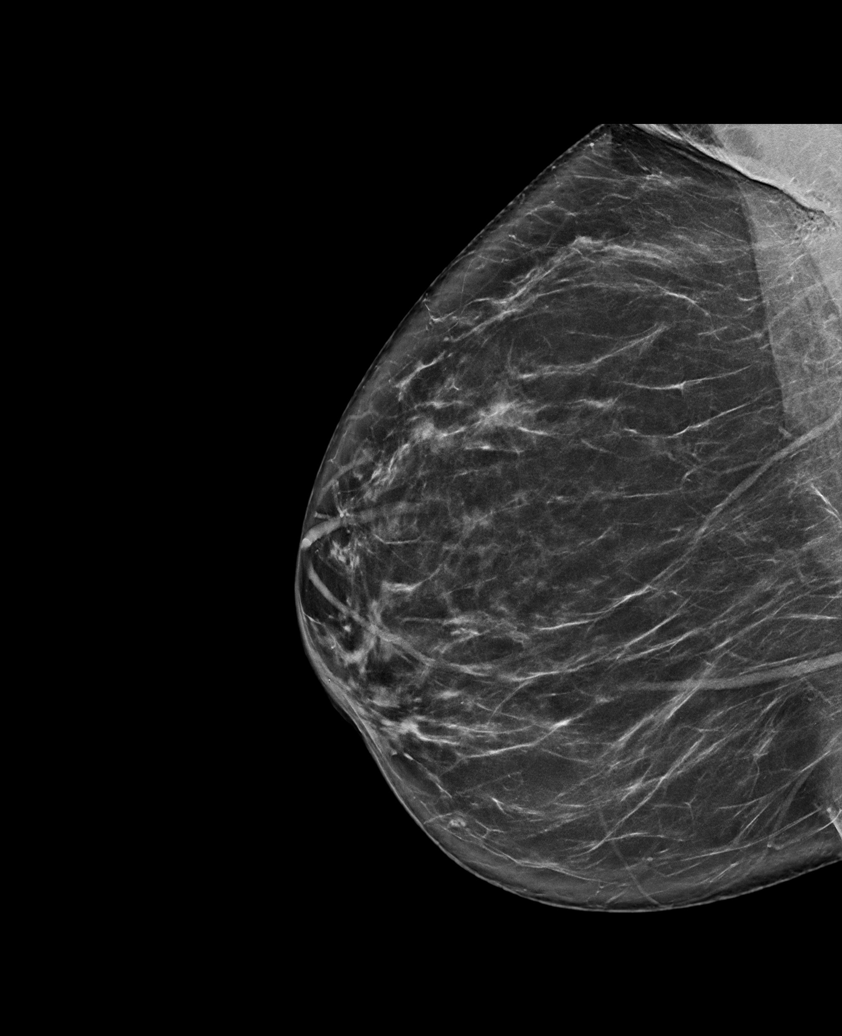

[R MLO]
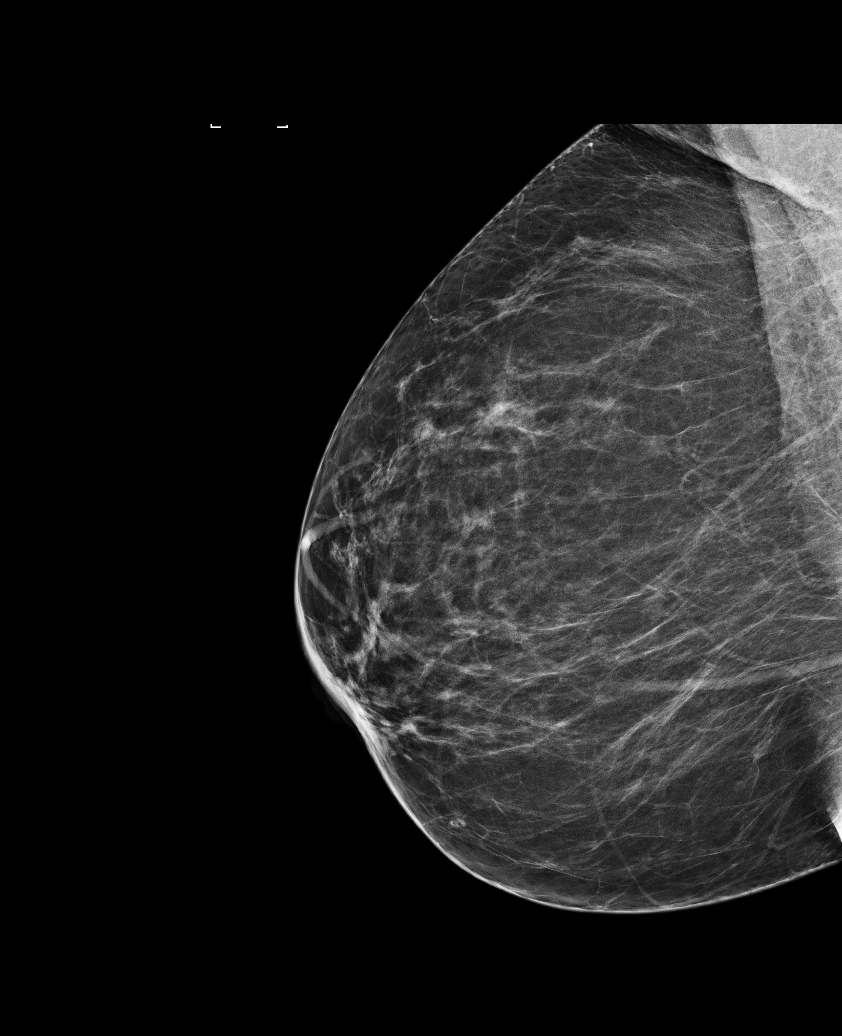

[R CC tomo · tomo slice 37/72.0]
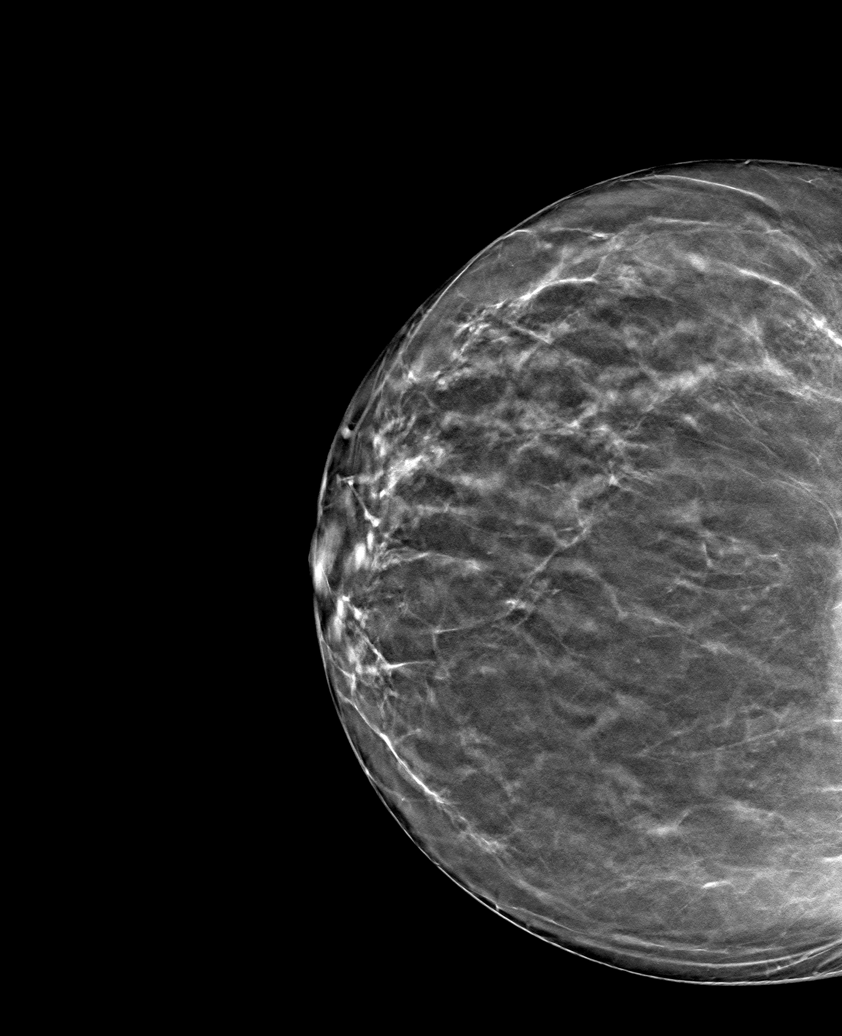

[R MLO tomo · tomo slice 41/82.0]
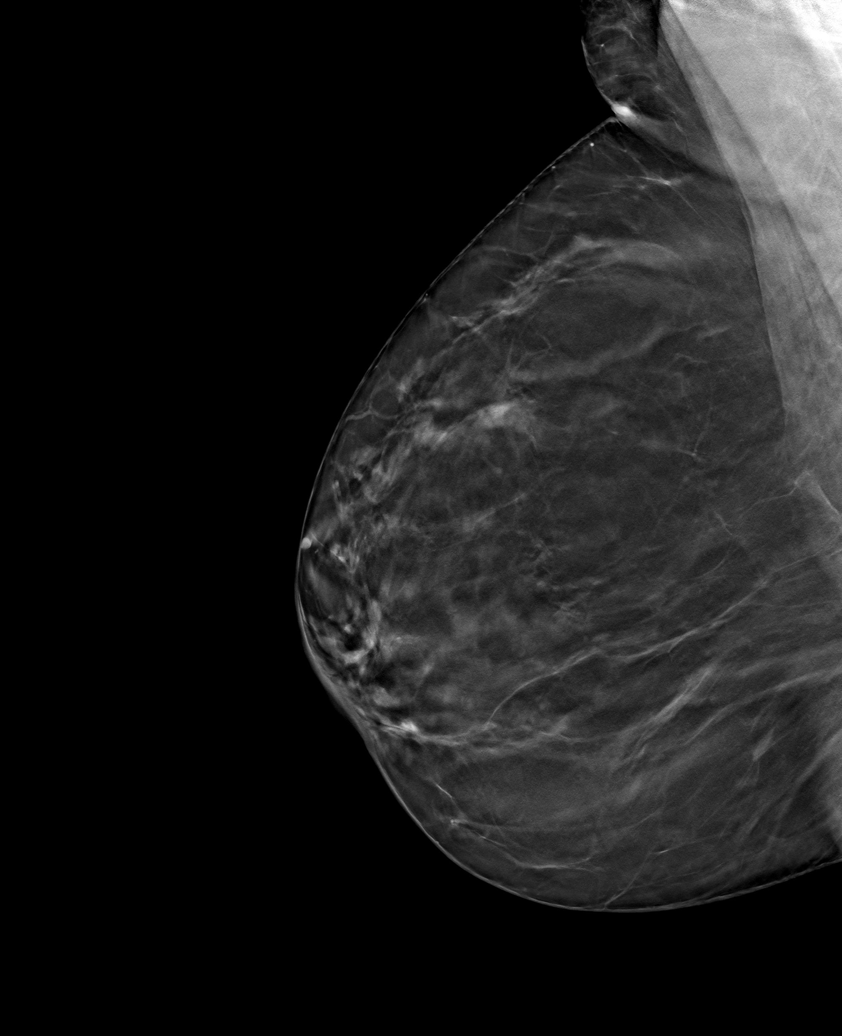

[6 of 14 positions shown; findings below may reference images not displayed]

ACR Breast Density Category b: There are scattered areas of
fibroglandular density.
FINDINGS: On today's additional diagnostic views, there is no persistent
abnormality within the right breast indicating superimposition of
normal fibroglandular tissues. There are no new dominant masses,
suspicious calcifications or secondary signs of malignancy
identified on today's exam.

Mammographic images were processed with CAD.
IMPRESSION: No evidence of malignancy. Patient may return to routine annual
bilateral screening mammogram schedule.

RECOMMENDATION:
Screening mammogram in one year.(Code:[id number])

I have discussed the findings and recommendations with the patient.
Results were also provided in writing at the conclusion of the
visit. If applicable, a reminder letter will be sent to the patient
regarding the next appointment.

BI-RADS CATEGORY  1: Negative.

## 2017-09-28 ENCOUNTER — Other Ambulatory Visit: Payer: Self-pay | Admitting: Obstetrics and Gynecology

## 2017-09-28 DIAGNOSIS — Z1231 Encounter for screening mammogram for malignant neoplasm of breast: Secondary | ICD-10-CM

## 2017-10-31 ENCOUNTER — Ambulatory Visit: Payer: BLUE CROSS/BLUE SHIELD

## 2018-01-17 ENCOUNTER — Ambulatory Visit: Payer: BLUE CROSS/BLUE SHIELD | Admitting: Mental Health

## 2018-01-25 ENCOUNTER — Ambulatory Visit: Payer: Self-pay | Admitting: Mental Health

## 2018-02-01 ENCOUNTER — Ambulatory Visit: Payer: Self-pay | Admitting: Mental Health

## 2018-02-14 DIAGNOSIS — M9901 Segmental and somatic dysfunction of cervical region: Secondary | ICD-10-CM | POA: Diagnosis not present

## 2018-02-14 DIAGNOSIS — M9902 Segmental and somatic dysfunction of thoracic region: Secondary | ICD-10-CM | POA: Diagnosis not present

## 2018-02-14 DIAGNOSIS — M9903 Segmental and somatic dysfunction of lumbar region: Secondary | ICD-10-CM | POA: Diagnosis not present

## 2018-02-14 DIAGNOSIS — M9905 Segmental and somatic dysfunction of pelvic region: Secondary | ICD-10-CM | POA: Diagnosis not present

## 2018-02-15 ENCOUNTER — Ambulatory Visit: Payer: Self-pay | Admitting: Mental Health

## 2018-02-18 DIAGNOSIS — M545 Low back pain: Secondary | ICD-10-CM | POA: Diagnosis not present

## 2018-02-18 DIAGNOSIS — M25512 Pain in left shoulder: Secondary | ICD-10-CM | POA: Diagnosis not present

## 2018-02-20 DIAGNOSIS — M9901 Segmental and somatic dysfunction of cervical region: Secondary | ICD-10-CM | POA: Diagnosis not present

## 2018-02-20 DIAGNOSIS — M9905 Segmental and somatic dysfunction of pelvic region: Secondary | ICD-10-CM | POA: Diagnosis not present

## 2018-02-20 DIAGNOSIS — M9903 Segmental and somatic dysfunction of lumbar region: Secondary | ICD-10-CM | POA: Diagnosis not present

## 2018-02-20 DIAGNOSIS — M9902 Segmental and somatic dysfunction of thoracic region: Secondary | ICD-10-CM | POA: Diagnosis not present

## 2018-02-21 DIAGNOSIS — L814 Other melanin hyperpigmentation: Secondary | ICD-10-CM | POA: Diagnosis not present

## 2018-02-21 DIAGNOSIS — D2271 Melanocytic nevi of right lower limb, including hip: Secondary | ICD-10-CM | POA: Diagnosis not present

## 2018-02-21 DIAGNOSIS — D225 Melanocytic nevi of trunk: Secondary | ICD-10-CM | POA: Diagnosis not present

## 2018-02-21 DIAGNOSIS — L700 Acne vulgaris: Secondary | ICD-10-CM | POA: Diagnosis not present

## 2018-02-22 DIAGNOSIS — L659 Nonscarring hair loss, unspecified: Secondary | ICD-10-CM | POA: Diagnosis not present

## 2018-02-22 DIAGNOSIS — L219 Seborrheic dermatitis, unspecified: Secondary | ICD-10-CM | POA: Diagnosis not present

## 2018-02-22 DIAGNOSIS — M545 Low back pain: Secondary | ICD-10-CM | POA: Diagnosis not present

## 2018-03-04 DIAGNOSIS — M545 Low back pain: Secondary | ICD-10-CM | POA: Diagnosis not present

## 2018-03-21 DIAGNOSIS — M9902 Segmental and somatic dysfunction of thoracic region: Secondary | ICD-10-CM | POA: Diagnosis not present

## 2018-03-21 DIAGNOSIS — M9905 Segmental and somatic dysfunction of pelvic region: Secondary | ICD-10-CM | POA: Diagnosis not present

## 2018-03-21 DIAGNOSIS — M9901 Segmental and somatic dysfunction of cervical region: Secondary | ICD-10-CM | POA: Diagnosis not present

## 2018-03-21 DIAGNOSIS — M545 Low back pain: Secondary | ICD-10-CM | POA: Diagnosis not present

## 2018-03-21 DIAGNOSIS — M9903 Segmental and somatic dysfunction of lumbar region: Secondary | ICD-10-CM | POA: Diagnosis not present

## 2018-03-27 DIAGNOSIS — M545 Low back pain: Secondary | ICD-10-CM | POA: Diagnosis not present

## 2018-04-01 DIAGNOSIS — M545 Low back pain: Secondary | ICD-10-CM | POA: Diagnosis not present

## 2018-04-09 DIAGNOSIS — M545 Low back pain: Secondary | ICD-10-CM | POA: Diagnosis not present

## 2018-04-15 DIAGNOSIS — M545 Low back pain: Secondary | ICD-10-CM | POA: Diagnosis not present

## 2018-04-22 DIAGNOSIS — M545 Low back pain: Secondary | ICD-10-CM | POA: Diagnosis not present

## 2018-05-01 DIAGNOSIS — M545 Low back pain: Secondary | ICD-10-CM | POA: Diagnosis not present

## 2018-05-08 DIAGNOSIS — M545 Low back pain: Secondary | ICD-10-CM | POA: Diagnosis not present

## 2018-06-05 DIAGNOSIS — M545 Low back pain: Secondary | ICD-10-CM | POA: Diagnosis not present

## 2018-07-02 DIAGNOSIS — L239 Allergic contact dermatitis, unspecified cause: Secondary | ICD-10-CM | POA: Diagnosis not present

## 2018-08-29 DIAGNOSIS — D225 Melanocytic nevi of trunk: Secondary | ICD-10-CM | POA: Diagnosis not present

## 2018-08-29 DIAGNOSIS — L821 Other seborrheic keratosis: Secondary | ICD-10-CM | POA: Diagnosis not present

## 2018-08-29 DIAGNOSIS — L718 Other rosacea: Secondary | ICD-10-CM | POA: Diagnosis not present

## 2018-08-29 DIAGNOSIS — D2262 Melanocytic nevi of left upper limb, including shoulder: Secondary | ICD-10-CM | POA: Diagnosis not present

## 2018-10-24 DIAGNOSIS — E039 Hypothyroidism, unspecified: Secondary | ICD-10-CM | POA: Diagnosis not present

## 2018-10-31 DIAGNOSIS — E049 Nontoxic goiter, unspecified: Secondary | ICD-10-CM | POA: Diagnosis not present

## 2018-10-31 DIAGNOSIS — E039 Hypothyroidism, unspecified: Secondary | ICD-10-CM | POA: Diagnosis not present

## 2018-11-02 ENCOUNTER — Other Ambulatory Visit: Payer: Self-pay | Admitting: Endocrinology

## 2018-11-02 DIAGNOSIS — E049 Nontoxic goiter, unspecified: Secondary | ICD-10-CM

## 2018-11-20 ENCOUNTER — Ambulatory Visit
Admission: RE | Admit: 2018-11-20 | Discharge: 2018-11-20 | Disposition: A | Discharge status: Home / Self Care | Payer: BLUE CROSS/BLUE SHIELD | Source: Ambulatory Visit | Attending: Endocrinology | Admitting: Endocrinology

## 2018-11-20 DIAGNOSIS — E041 Nontoxic single thyroid nodule: Secondary | ICD-10-CM | POA: Diagnosis not present

## 2018-11-20 DIAGNOSIS — E049 Nontoxic goiter, unspecified: Secondary | ICD-10-CM

## 2018-11-27 ENCOUNTER — Other Ambulatory Visit: Payer: BLUE CROSS/BLUE SHIELD

## 2019-02-11 DIAGNOSIS — Z1231 Encounter for screening mammogram for malignant neoplasm of breast: Secondary | ICD-10-CM | POA: Diagnosis not present

## 2019-02-11 DIAGNOSIS — Z6830 Body mass index (BMI) 30.0-30.9, adult: Secondary | ICD-10-CM | POA: Diagnosis not present

## 2019-02-11 DIAGNOSIS — Z01419 Encounter for gynecological examination (general) (routine) without abnormal findings: Secondary | ICD-10-CM | POA: Diagnosis not present

## 2019-02-20 DIAGNOSIS — D225 Melanocytic nevi of trunk: Secondary | ICD-10-CM | POA: Diagnosis not present

## 2019-02-20 DIAGNOSIS — Z85820 Personal history of malignant melanoma of skin: Secondary | ICD-10-CM | POA: Diagnosis not present

## 2019-02-20 DIAGNOSIS — L239 Allergic contact dermatitis, unspecified cause: Secondary | ICD-10-CM | POA: Diagnosis not present

## 2019-02-20 DIAGNOSIS — L814 Other melanin hyperpigmentation: Secondary | ICD-10-CM | POA: Diagnosis not present

## 2019-04-09 ENCOUNTER — Ambulatory Visit (INDEPENDENT_AMBULATORY_CARE_PROVIDER_SITE_OTHER): Payer: BC Managed Care – PPO | Admitting: Internal Medicine

## 2019-04-09 ENCOUNTER — Other Ambulatory Visit: Payer: Self-pay

## 2019-04-09 ENCOUNTER — Encounter (INDEPENDENT_AMBULATORY_CARE_PROVIDER_SITE_OTHER): Payer: Self-pay | Admitting: Internal Medicine

## 2019-04-09 ENCOUNTER — Encounter (INDEPENDENT_AMBULATORY_CARE_PROVIDER_SITE_OTHER): Payer: Self-pay

## 2019-04-09 VITALS — BP 110/70 | HR 100 | Temp 97.4°F | Resp 18 | Ht 65.0 in | Wt 177.4 lb

## 2019-04-09 DIAGNOSIS — R5381 Other malaise: Secondary | ICD-10-CM | POA: Diagnosis not present

## 2019-04-09 DIAGNOSIS — M8580 Other specified disorders of bone density and structure, unspecified site: Secondary | ICD-10-CM

## 2019-04-09 DIAGNOSIS — R5383 Other fatigue: Secondary | ICD-10-CM

## 2019-04-09 DIAGNOSIS — Z13220 Encounter for screening for lipoid disorders: Secondary | ICD-10-CM | POA: Diagnosis not present

## 2019-04-09 DIAGNOSIS — E559 Vitamin D deficiency, unspecified: Secondary | ICD-10-CM

## 2019-04-09 DIAGNOSIS — E2839 Other primary ovarian failure: Secondary | ICD-10-CM

## 2019-04-09 DIAGNOSIS — R6882 Decreased libido: Secondary | ICD-10-CM

## 2019-04-09 DIAGNOSIS — E039 Hypothyroidism, unspecified: Secondary | ICD-10-CM | POA: Diagnosis not present

## 2019-04-09 DIAGNOSIS — Z131 Encounter for screening for diabetes mellitus: Secondary | ICD-10-CM

## 2019-04-09 DIAGNOSIS — Z780 Asymptomatic menopausal state: Secondary | ICD-10-CM

## 2019-04-09 HISTORY — DX: Other specified disorders of bone density and structure, unspecified site: M85.80

## 2019-04-09 HISTORY — DX: Asymptomatic menopausal state: Z78.0

## 2019-04-09 HISTORY — DX: Hypothyroidism, unspecified: E03.9

## 2019-04-09 NOTE — Progress Notes (Signed)
Metrics: Intervention Frequency ACO  Documented Smoking Status Yearly  Screened one or more times in 24 months  Cessation Counseling or  Active cessation medication Past 24 months  Past 24 months   Guideline developer: UpToDate (See UpToDate for funding source) Date Released: 2014       Wellness Office Visit  Subjective:  Patient ID: Katie Carney, female    DOB: 01/22/1966  Age: 53 y.o. MRN: [id number]  CC: This 53 year old lady comes to our practice as a new patient, she was referred by her OB/GYN. HPI  Her main symptoms include fatigue, diagnosis previously of hypothyroidism and history of osteopenia. On closer questioning, she describes decreased significantly libido. She would like to be better focused and concentrated also. She has been taking bioidentical hormone therapy in the form of creams for several years. She does not know if she needs to continue taking Armour Thyroid as she has been doing. She currently feels stress at work and also at home. Although she walks, I do not think she does any further consistent exercise. Past Medical History:  Diagnosis Date  . Hypothyroidism, adult 04/09/2019  . Osteopenia after menopause 04/09/2019      Family History  Problem Relation Age of Onset  . Dementia Maternal Aunt     Social History   Social History Narrative   Married for last 29 years,second marriage.Lives with husband.   Caffeine use: Drinks organic coffee in the morning   Works in [employer] at [employer].Works from home.   Social History   Tobacco Use  . Smoking status: Former Research scientist (life sciences)  . Smokeless tobacco: Never Used  Substance Use Topics  . Alcohol use: Yes    Alcohol/week: 1.0 - 2.0 standard drinks    Types: 1 - 2 Glasses of wine per week    Current Meds  Medication Sig  . Estriol 10 % CREA Apply 1 application topically daily.  . Multiple Vitamin (MULTIVITAMIN) tablet Take 1 tablet by mouth.  . NON FORMULARY Progesterone HRT 20%  . Olopatadine HCl  (PATADAY) 0.2 % SOLN 1 drop as needed.   . thyroid (ARMOUR THYROID) 30 MG tablet Take 45 mg by mouth.   . [DISCONTINUED] DHA-EPA-Vitamin E [id number] MG-MG-UNIT CAPS Take by mouth.  . [DISCONTINUED] levocetirizine (XYZAL) 5 MG tablet Take 5 mg by mouth as needed.   . [DISCONTINUED] UNABLE TO FIND        Objective:   Today's Vitals: BP 110/70 (BP Location: Right Arm, Patient Position: Sitting, Cuff Size: Normal)   Pulse 100   Temp (!) 97.4 F (36.3 C) (Temporal)   Resp 18   Ht '5\' 5"'  (1.651 m)   Wt 177 lb 6.4 oz (80.5 kg)   LMP 10/04/2011   SpO2 92% Comment: wearing a mask.  BMI 29.52 kg/m  Vitals with BMI 04/09/2019 01/04/2015 12/11/2014  Height '5\' 5"'  - 5' 4.5"  Weight 177 lbs 6 oz 179 lbs 179 lbs 3 oz  BMI 87.68 - 11.5  Systolic 726 - 203  Diastolic 70 - 78  Pulse 559 - 72     Physical Exam  She looks systemically well.  She is overweight, almost obese.  She is alert and orientated.     Assessment   1. Malaise and fatigue   2. Hypothyroidism, adult   3. Vitamin D deficiency disease   4. Decreased libido   5. Primary ovarian failure   6. Screening for diabetes mellitus   7. Screening for lipoid disorders   8. Osteopenia after  menopause       Tests ordered Orders Placed This Encounter  Procedures  . CBC  . CMP with eGFR([facility])  . Estradiol  . Hemoglobin A1c  . Lipid Panel  . T3, Free  . TSH  . Vitamin D, 25-hydroxy  . Testos,Total,Free and SHBG (Female)  . Progesterone  . T4     Plan: 1. Blood work is ordered as above. 2. She will continue with all medications for the time being that she is on and we had a long discussion regarding overall health and wellness with nutrition, exercise and bioidentical hormone optimization being the foundations of wellness as well as improvement of mental state.  We will work on all of these aspects as time goes on. 3. I will see her in the next several weeks to have a detailed discussion regarding her blood work and  overall wellness. 4. Today I spent 1 hour with this patient face-to-face, more than 50% of the time was involved in answering all her questions regarding her wellness and discussing her symptoms.   No orders of the defined types were placed in this encounter.   Doree Albee, MD

## 2019-04-14 LAB — COMPLETE METABOLIC PANEL WITH GFR
AG Ratio: 2 (calc) (ref 1.0–2.5)
ALT: 19 U/L (ref 6–29)
AST: 19 U/L (ref 10–35)
Albumin: 4.3 g/dL (ref 3.6–5.1)
Alkaline phosphatase (APISO): 41 U/L (ref 37–153)
BUN: 12 mg/dL (ref 7–25)
CO2: 29 mmol/L (ref 20–32)
Calcium: 9.4 mg/dL (ref 8.6–10.4)
Chloride: 103 mmol/L (ref 98–110)
Creat: 0.81 mg/dL (ref 0.50–1.05)
GFR, Est African American: 95 mL/min/{1.73_m2} (ref >=60)
GFR, Est Non African American: 82 mL/min/{1.73_m2} (ref >=60)
Globulin: 2.1 g/dL (calc) (ref 1.9–3.7)
Glucose, Bld: 93 mg/dL (ref 65–99)
Potassium: 3.7 mmol/L (ref 3.5–5.3)
Sodium: 140 mmol/L (ref 135–146)
Total Bilirubin: 0.3 mg/dL (ref 0.2–1.2)
Total Protein: 6.4 g/dL (ref 6.1–8.1)

## 2019-04-14 LAB — TESTOS,TOTAL,FREE AND SHBG (FEMALE)
Free Testosterone: 0.9 pg/mL (ref 0.1–6.4)
Sex Hormone Binding: 112 nmol/L (ref 17–124)
Testosterone, Total, LC-MS-MS: 20 ng/dL (ref 2–45)

## 2019-04-14 LAB — ESTRADIOL: Estradiol: 25 pg/mL

## 2019-04-14 LAB — CBC
HCT: 41.2 % (ref 35.0–45.0)
Hemoglobin: 13.7 g/dL (ref 11.7–15.5)
MCH: 29.6 pg (ref 27.0–33.0)
MCHC: 33.3 g/dL (ref 32.0–36.0)
MCV: 89 fL (ref 80.0–100.0)
MPV: 9.8 fL (ref 7.5–12.5)
Platelets: 314 10*3/uL (ref 140–400)
RBC: 4.63 10*6/uL (ref 3.80–5.10)
RDW: 12 % (ref 11.0–15.0)
WBC: 7 10*3/uL (ref 3.8–10.8)

## 2019-04-14 LAB — LIPID PANEL
Cholesterol: 199 mg/dL (ref <200)
HDL: 71 mg/dL (ref >=50)
LDL Cholesterol (Calc): 109 mg/dL (calc) — ABNORMAL HIGH
Non-HDL Cholesterol (Calc): 128 mg/dL (calc) (ref <130)
Total CHOL/HDL Ratio: 2.8 (calc) (ref <5.0)
Triglycerides: 92 mg/dL (ref <150)

## 2019-04-14 LAB — PROGESTERONE: Progesterone: 0.8 ng/mL

## 2019-04-14 LAB — HEMOGLOBIN A1C
Hgb A1c MFr Bld: 5.1 % of total Hgb (ref <5.7)
Mean Plasma Glucose: 100 (calc)
eAG (mmol/L): 5.5 (calc)

## 2019-04-14 LAB — VITAMIN D 25 HYDROXY (VIT D DEFICIENCY, FRACTURES): Vit D, 25-Hydroxy: 38 ng/mL (ref 30–100)

## 2019-04-14 LAB — TSH: TSH: 3.43 mIU/L

## 2019-04-14 LAB — T3, FREE: T3, Free: 3.4 pg/mL (ref 2.3–4.2)

## 2019-04-14 LAB — T4: T4, Total: 7.6 ug/dL (ref 5.1–11.9)

## 2019-04-17 ENCOUNTER — Other Ambulatory Visit (INDEPENDENT_AMBULATORY_CARE_PROVIDER_SITE_OTHER): Payer: Self-pay | Admitting: Internal Medicine

## 2019-04-17 ENCOUNTER — Telehealth (INDEPENDENT_AMBULATORY_CARE_PROVIDER_SITE_OTHER): Payer: Self-pay | Admitting: Internal Medicine

## 2019-04-17 DIAGNOSIS — M9903 Segmental and somatic dysfunction of lumbar region: Secondary | ICD-10-CM | POA: Diagnosis not present

## 2019-04-17 DIAGNOSIS — M9901 Segmental and somatic dysfunction of cervical region: Secondary | ICD-10-CM | POA: Diagnosis not present

## 2019-04-17 DIAGNOSIS — M5411 Radiculopathy, occipito-atlanto-axial region: Secondary | ICD-10-CM | POA: Diagnosis not present

## 2019-04-17 MED ORDER — THYROID 30 MG PO TABS: 30.0000 mg | ORAL_TABLET | Freq: Every day | ORAL | 0 refills | Status: DC

## 2019-04-21 NOTE — Telephone Encounter (Signed)
Done

## 2019-04-24 DIAGNOSIS — M9903 Segmental and somatic dysfunction of lumbar region: Secondary | ICD-10-CM | POA: Diagnosis not present

## 2019-04-24 DIAGNOSIS — M9901 Segmental and somatic dysfunction of cervical region: Secondary | ICD-10-CM | POA: Diagnosis not present

## 2019-04-24 DIAGNOSIS — M5411 Radiculopathy, occipito-atlanto-axial region: Secondary | ICD-10-CM | POA: Diagnosis not present

## 2019-04-28 DIAGNOSIS — M9903 Segmental and somatic dysfunction of lumbar region: Secondary | ICD-10-CM | POA: Diagnosis not present

## 2019-04-28 DIAGNOSIS — M5411 Radiculopathy, occipito-atlanto-axial region: Secondary | ICD-10-CM | POA: Diagnosis not present

## 2019-04-28 DIAGNOSIS — M9901 Segmental and somatic dysfunction of cervical region: Secondary | ICD-10-CM | POA: Diagnosis not present

## 2019-04-29 DIAGNOSIS — M5411 Radiculopathy, occipito-atlanto-axial region: Secondary | ICD-10-CM | POA: Diagnosis not present

## 2019-04-29 DIAGNOSIS — M9903 Segmental and somatic dysfunction of lumbar region: Secondary | ICD-10-CM | POA: Diagnosis not present

## 2019-04-29 DIAGNOSIS — M9901 Segmental and somatic dysfunction of cervical region: Secondary | ICD-10-CM | POA: Diagnosis not present

## 2019-04-30 DIAGNOSIS — M9901 Segmental and somatic dysfunction of cervical region: Secondary | ICD-10-CM | POA: Diagnosis not present

## 2019-04-30 DIAGNOSIS — M5411 Radiculopathy, occipito-atlanto-axial region: Secondary | ICD-10-CM | POA: Diagnosis not present

## 2019-04-30 DIAGNOSIS — M9903 Segmental and somatic dysfunction of lumbar region: Secondary | ICD-10-CM | POA: Diagnosis not present

## 2019-05-01 ENCOUNTER — Other Ambulatory Visit: Payer: Self-pay

## 2019-05-01 ENCOUNTER — Encounter (INDEPENDENT_AMBULATORY_CARE_PROVIDER_SITE_OTHER): Payer: Self-pay | Admitting: Internal Medicine

## 2019-05-01 ENCOUNTER — Ambulatory Visit (INDEPENDENT_AMBULATORY_CARE_PROVIDER_SITE_OTHER): Payer: BC Managed Care – PPO | Admitting: Internal Medicine

## 2019-05-01 VITALS — BP 120/70 | HR 91 | Ht 64.5 in | Wt 180.4 lb

## 2019-05-01 DIAGNOSIS — E2839 Other primary ovarian failure: Secondary | ICD-10-CM

## 2019-05-01 DIAGNOSIS — E039 Hypothyroidism, unspecified: Secondary | ICD-10-CM | POA: Diagnosis not present

## 2019-05-01 DIAGNOSIS — E559 Vitamin D deficiency, unspecified: Secondary | ICD-10-CM

## 2019-05-01 MED ORDER — ESTRADIOL 0.5 MG PO TABS: 0.5000 mg | ORAL_TABLET | Freq: Every day | ORAL | 2 refills | Status: DC

## 2019-05-01 MED ORDER — ARMOUR THYROID 60 MG PO TABS: 60.0000 mg | ORAL_TABLET | Freq: Every day | ORAL | 0 refills | Status: DC

## 2019-05-01 MED ORDER — PROGESTERONE MICRONIZED 100 MG PO CAPS: 100.0000 mg | ORAL_CAPSULE | Freq: Every day | ORAL | 2 refills | Status: DC

## 2019-05-01 NOTE — Patient Instructions (Signed)
[facility] Dietary Recommendations for Weight Loss What to Avoid . Avoid added sugars o Often added sugar can be found in processed foods such as many condiments, dry cereals, cakes, cookies, chips, crisps, crackers, candies, sweetened drinks, etc.  o Read labels and AVOID/DECREASE use of foods with the following in their ingredient list: Sugar, fructose, high fructose corn syrup, sucrose, glucose, maltose, dextrose, molasses, cane sugar, brown sugar, any type of syrup, agave nectar, etc.   . Avoid snacking in between meals . Avoid foods made with flour o If you are going to eat food made with flour, choose those made with whole-grains; and, minimize your consumption as much as is tolerable . Avoid processed foods o These foods are generally stocked in the middle of the grocery store. Focus on shopping on the perimeter of the grocery.  . Avoid Meat  o We recommend following a plant-based diet at [facility]. Thus, we recommend avoiding meat as a general rule. Consider eating beans, legumes, eggs, and/or dairy products for regular protein sources o If you plan on eating meat limit to 4 ounces of meat at a time and choose lean options such as Fish, chicken, turkey. Avoid red meat intake such as pork and/or steak What to Include . Vegetables o GREEN LEAFY VEGETABLES: Kale, spinach, mustard greens, collard greens, cabbage, broccoli, etc. o OTHER: Asparagus, cauliflower, eggplant, carrots, peas, Brussel sprouts, tomatoes, bell peppers, zucchini, beets, cucumbers, etc. . Grains, seeds, and legumes o Beans: kidney beans, black eyed peas, garbanzo beans, black beans, pinto beans, etc. o Whole, unrefined grains: brown rice, barley, bulgur, oatmeal, etc. . Healthy fats  o Avoid highly processed fats such as vegetable oil o Examples of healthy fats: avocado, olives, virgin olive oil, dark chocolate (?72% Cocoa), nuts (peanuts, almonds, walnuts, cashews, pecans, etc.) . None to Low  Intake of Animal Sources of Protein o Meat sources: chicken, turkey, salmon, tuna. Limit to 4 ounces of meat at one time. o Consider limiting dairy sources, but when choosing dairy focus on: PLAIN Greek yogurt, cottage cheese, high-protein milk . Fruit o Choose berries  When to Eat . Intermittent Fasting: o Choosing not to eat for a specific time period, but DO FOCUS ON HYDRATION when fasting o Multiple Techniques: - Time Restricted Eating: eat 3 meals in a day, each meal lasting no more than 60 minutes, no snacks between meals - 16-18 hour fast: fast for 16 to 18 hours up to 7 days a week. Often suggested to start with 2-3 nonconsecutive days per week.  . Remember the time you sleep is counted as fasting.  . Examples of eating schedule: Fast from 7:00pm-11:00am. Eat between 11:00am-7:00pm.  - 24-hour fast: fast for 24 hours up to every other day. Often suggested to start with 1 day per week . Remember the time you sleep is counted as fasting . Examples of eating schedule:  o Eating day: eat 2-3 meals on your eating day. If doing 2 meals, each meal should last no more than 90 minutes. If doing 3 meals, each meal should last no more than 60 minutes. Finish last meal by 7:00pm. o Fasting day: Fast until 7:00pm.  o IF YOU FEEL UNWELL FOR ANY REASON/IN ANY WAY WHEN FASTING, STOP FASTING BY EATING A NUTRITIOUS SNACK OR LIGHT MEAL o ALWAYS FOCUS ON HYDRATION DURING FASTS - Acceptable Hydration sources: water, broths, tea/coffee (black tea/coffee is best but using a small amount of whole-fat dairy products in coffee/tea is acceptable).  -   Poor Hydration Sources: anything with sugar or artificial sweeteners added to it  These recommendations have been developed for patients that are actively receiving medical care from either Dr. Iaan Oregel or Sarah Gray, DNP, NP-C at [facility]. These recommendations are developed for patients with specific medical conditions and are not meant to be  distributed or used by others that are not actively receiving care from either provider listed above at [facility]. It is not appropriate to participate in the above eating plans without proper medical supervision.   Reference: Fung, J. The obesity code. [place]: [place]; 2016.   

## 2019-05-01 NOTE — Progress Notes (Addendum)
Metrics: Intervention Frequency ACO  Documented Smoking Status Yearly  Screened one or more times in 24 months  Cessation Counseling or  Active cessation medication Past 24 months  Past 24 months   Guideline developer: UpToDate (See UpToDate for funding source) Date Released: 2014       Wellness Office Visit  Subjective:  Patient ID: Katie Carney, female    DOB: 1965-07-27  Age: 54 y.o. MRN: [id number]  CC: This lady comes in to discuss all her blood results and further recommendations. HPI I discussed all her blood results in detail.  She has suboptimal T3 levels.  Thankfully, she is not diabetic. She has suboptimal estradiol levels and almost nonexistent progesterone levels.  Her testosterone levels are also suboptimal. Her vitamin D levels are also suboptimal. Her wishes are to lose approximately 20 to 25 pounds in become healthier.  Past Medical History:  Diagnosis Date  . Hypothyroidism, adult 04/09/2019  . Osteopenia after menopause 04/09/2019      Family History  Problem Relation Age of Onset  . Dementia Maternal Aunt     Social History   Social History Narrative   Married for last 15 years,second marriage.Lives with husband.   Caffeine use: Drinks organic coffee in the morning   Works in [employer] at [employer].Works from home.   Social History   Tobacco Use  . Smoking status: Former Games developer  . Smokeless tobacco: Never Used  Substance Use Topics  . Alcohol use: Yes    Alcohol/week: 1.0 - 2.0 standard drinks    Types: 1 - 2 Glasses of wine per week    Current Meds  Medication Sig  . Estriol 10 % CREA Apply 1 application topically daily.  . Multiple Vitamin (MULTIVITAMIN) tablet Take 1 tablet by mouth.  . NON FORMULARY Progesterone HRT 20%  . Olopatadine HCl (PATADAY) 0.2 % SOLN 1 drop as needed.   . thyroid (ARMOUR THYROID) 30 MG tablet Take 1 tablet (30 mg total) by mouth daily before breakfast.     Nutrition  In the past, she has done intermittent  fasting but at the present time she admits that, most of the time she does try to eat healthier but she can stress eat.  Exercise  She walks most of the time during the week. Bio Identical Hormones  She is taking topical estradiol/estriol and topical testosterone.  Objective:   Today's Vitals: BP 120/70   Pulse 91   Ht 5' 4.5" (1.638 m)   Wt 180 lb 6.4 oz (81.8 kg)   LMP 10/04/2011   SpO2 99%   BMI 30.49 kg/m  Vitals with BMI 05/01/2019 04/09/2019 01/04/2015  Height 5' 4.5" 5\' 5"  -  Weight 180 lbs 6 oz 177 lbs 6 oz 179 lbs  BMI 30.5 29.52 -  Systolic 120 110 -  Diastolic 70 70 -  Pulse 91 100 -     Physical Exam   No new physical findings today.    Assessment   1. Primary ovarian failure   2. Hypothyroidism, adult   3. Vitamin D deficiency disease       Tests ordered No orders of the defined types were placed in this encounter.    Plan: 1. I have recommended that she increase the dose of Armour Thyroid and I have sent a new prescription of Armour Thyroid 60 mg daily. 2. After a long discussion, she would like to try oral estradiol and progesterone and I have sent prescriptions of these to her pharmacy.  I answered all the questions regarding possible side effects and benefits of taking bioidentical hormones in this fashion orally. 3. I also discussed testosterone therapy in the future and modes of administration. 4. I also had a discussion regarding nutrition and she already does intermittent fasting but I would recommend that she fast for 16 hours every day together with a whole food plant-based diet predominantly.  I encourage plenty of water intake. 5. I will see her in the next several weeks for follow-up to see how she is doing and we will likely do blood work at that time.   Meds ordered this encounter  Medications  . ARMOUR THYROID 60 MG tablet    Sig: Take 1 tablet (60 mg total) by mouth daily before breakfast.    Dispense:  90 tablet    Refill:  0   . estradiol (ESTRACE) 0.5 MG tablet    Sig: Take 1 tablet (0.5 mg total) by mouth daily.    Dispense:  30 tablet    Refill:  2  . progesterone (PROMETRIUM) 100 MG capsule    Sig: Take 1 capsule (100 mg total) by mouth daily.    Dispense:  30 capsule    Refill:  2    Talib Headley C Adreena Willits, MD  TIME SPENT: 45 minutes discussing bioidentical hormones above as well as nutrition above.

## 2019-05-02 NOTE — Addendum Note (Signed)
Addended by: Wilson Singer on: 05/02/2019 09:22 AM   Modules accepted: Level of Service

## 2019-05-02 NOTE — Addendum Note (Signed)
Addended by: Wilson Singer on: 05/02/2019 12:24 PM   Modules accepted: Level of Service

## 2019-05-05 DIAGNOSIS — M9901 Segmental and somatic dysfunction of cervical region: Secondary | ICD-10-CM | POA: Diagnosis not present

## 2019-05-05 DIAGNOSIS — M9903 Segmental and somatic dysfunction of lumbar region: Secondary | ICD-10-CM | POA: Diagnosis not present

## 2019-05-05 DIAGNOSIS — M5411 Radiculopathy, occipito-atlanto-axial region: Secondary | ICD-10-CM | POA: Diagnosis not present

## 2019-05-08 DIAGNOSIS — M5411 Radiculopathy, occipito-atlanto-axial region: Secondary | ICD-10-CM | POA: Diagnosis not present

## 2019-05-08 DIAGNOSIS — M9903 Segmental and somatic dysfunction of lumbar region: Secondary | ICD-10-CM | POA: Diagnosis not present

## 2019-05-08 DIAGNOSIS — M9901 Segmental and somatic dysfunction of cervical region: Secondary | ICD-10-CM | POA: Diagnosis not present

## 2019-05-12 ENCOUNTER — Telehealth (INDEPENDENT_AMBULATORY_CARE_PROVIDER_SITE_OTHER): Payer: Self-pay

## 2019-05-12 NOTE — Telephone Encounter (Signed)
Pt is calling states she started the 60mg thyroid on the 15 after her appt, and states that she has felt her heart flutter 3/4 times, and feels little stressed, also should she go back to taken 45mg most of 2020 she was cutting them in half, but is sure she was not getting 45 mg.  Pt also has questions regarding Replacement therapy, she has been reading more online about it. 

## 2019-05-12 NOTE — Telephone Encounter (Signed)
Pt is calling states she started the 60mg  thyroid on the 15 after her appt, and states that she has felt her heart flutter 3/4 times, and feels little stressed, also should she go back to taken 45mg  most of 2020 she was cutting them in half, but is sure she was not getting 45 mg.  Pt also has questions regarding Replacement therapy, she has been reading more online about it.

## 2019-05-13 DIAGNOSIS — M9903 Segmental and somatic dysfunction of lumbar region: Secondary | ICD-10-CM | POA: Diagnosis not present

## 2019-05-13 DIAGNOSIS — M5411 Radiculopathy, occipito-atlanto-axial region: Secondary | ICD-10-CM | POA: Diagnosis not present

## 2019-05-13 DIAGNOSIS — M9901 Segmental and somatic dysfunction of cervical region: Secondary | ICD-10-CM | POA: Diagnosis not present

## 2019-05-13 NOTE — Telephone Encounter (Signed)
Please make a virtual appointment sometime this week so I can discuss with her all these issues. In the meantime,tell her to stop taking the NP THYROID until I speak to her.

## 2019-05-14 ENCOUNTER — Encounter (INDEPENDENT_AMBULATORY_CARE_PROVIDER_SITE_OTHER): Payer: Self-pay

## 2019-05-14 NOTE — Telephone Encounter (Signed)
I have reached out to the pt, no answer not voicemail

## 2019-05-14 NOTE — Telephone Encounter (Signed)
Pt is stating that she is not sure she can do a tele visit tomorrow, not sure what her insurance will pay, and she has a new ded. Then she states she is 20 mins late for a meeting and she had to go

## 2019-05-15 ENCOUNTER — Other Ambulatory Visit: Payer: Self-pay

## 2019-05-15 ENCOUNTER — Encounter (INDEPENDENT_AMBULATORY_CARE_PROVIDER_SITE_OTHER): Payer: Self-pay | Admitting: Internal Medicine

## 2019-05-15 ENCOUNTER — Telehealth (INDEPENDENT_AMBULATORY_CARE_PROVIDER_SITE_OTHER): Payer: BC Managed Care – PPO | Admitting: Internal Medicine

## 2019-05-15 ENCOUNTER — Other Ambulatory Visit (INDEPENDENT_AMBULATORY_CARE_PROVIDER_SITE_OTHER): Payer: Self-pay | Admitting: Internal Medicine

## 2019-05-15 DIAGNOSIS — M9901 Segmental and somatic dysfunction of cervical region: Secondary | ICD-10-CM | POA: Diagnosis not present

## 2019-05-15 DIAGNOSIS — M9903 Segmental and somatic dysfunction of lumbar region: Secondary | ICD-10-CM | POA: Diagnosis not present

## 2019-05-15 DIAGNOSIS — M5411 Radiculopathy, occipito-atlanto-axial region: Secondary | ICD-10-CM | POA: Diagnosis not present

## 2019-05-15 MED ORDER — ESTRADIOL 0.025 MG/24HR TD PTTW: 1.0000 | MEDICATED_PATCH | TRANSDERMAL | 2 refills | Status: DC

## 2019-05-20 DIAGNOSIS — M5411 Radiculopathy, occipito-atlanto-axial region: Secondary | ICD-10-CM | POA: Diagnosis not present

## 2019-05-20 DIAGNOSIS — M9903 Segmental and somatic dysfunction of lumbar region: Secondary | ICD-10-CM | POA: Diagnosis not present

## 2019-05-20 DIAGNOSIS — M9901 Segmental and somatic dysfunction of cervical region: Secondary | ICD-10-CM | POA: Diagnosis not present

## 2019-06-02 ENCOUNTER — Other Ambulatory Visit (INDEPENDENT_AMBULATORY_CARE_PROVIDER_SITE_OTHER): Payer: Self-pay

## 2019-06-02 ENCOUNTER — Other Ambulatory Visit (INDEPENDENT_AMBULATORY_CARE_PROVIDER_SITE_OTHER): Payer: Self-pay | Admitting: Internal Medicine

## 2019-06-02 ENCOUNTER — Telehealth (INDEPENDENT_AMBULATORY_CARE_PROVIDER_SITE_OTHER): Payer: Self-pay | Admitting: Internal Medicine

## 2019-06-02 DIAGNOSIS — M9903 Segmental and somatic dysfunction of lumbar region: Secondary | ICD-10-CM | POA: Diagnosis not present

## 2019-06-02 DIAGNOSIS — M5411 Radiculopathy, occipito-atlanto-axial region: Secondary | ICD-10-CM | POA: Diagnosis not present

## 2019-06-02 DIAGNOSIS — M9901 Segmental and somatic dysfunction of cervical region: Secondary | ICD-10-CM | POA: Diagnosis not present

## 2019-06-02 DIAGNOSIS — E039 Hypothyroidism, unspecified: Secondary | ICD-10-CM

## 2019-06-02 MED ORDER — THYROID 30 MG PO TABS: 30.0000 mg | ORAL_TABLET | Freq: Every day | ORAL | 0 refills | Status: DC

## 2019-06-09 NOTE — Telephone Encounter (Signed)
Done

## 2019-06-10 ENCOUNTER — Encounter (INDEPENDENT_AMBULATORY_CARE_PROVIDER_SITE_OTHER): Payer: Self-pay | Admitting: Internal Medicine

## 2019-06-11 NOTE — Telephone Encounter (Signed)
I am totally lost to what she is saying far as  armour thyroid meds.Katie Carney

## 2019-06-12 DIAGNOSIS — M9901 Segmental and somatic dysfunction of cervical region: Secondary | ICD-10-CM | POA: Diagnosis not present

## 2019-06-12 DIAGNOSIS — M5411 Radiculopathy, occipito-atlanto-axial region: Secondary | ICD-10-CM | POA: Diagnosis not present

## 2019-06-12 DIAGNOSIS — M9903 Segmental and somatic dysfunction of lumbar region: Secondary | ICD-10-CM | POA: Diagnosis not present

## 2019-06-17 DIAGNOSIS — M9903 Segmental and somatic dysfunction of lumbar region: Secondary | ICD-10-CM | POA: Diagnosis not present

## 2019-06-17 DIAGNOSIS — M5411 Radiculopathy, occipito-atlanto-axial region: Secondary | ICD-10-CM | POA: Diagnosis not present

## 2019-06-17 DIAGNOSIS — M9901 Segmental and somatic dysfunction of cervical region: Secondary | ICD-10-CM | POA: Diagnosis not present

## 2019-06-24 DIAGNOSIS — M5411 Radiculopathy, occipito-atlanto-axial region: Secondary | ICD-10-CM | POA: Diagnosis not present

## 2019-06-24 DIAGNOSIS — M9903 Segmental and somatic dysfunction of lumbar region: Secondary | ICD-10-CM | POA: Diagnosis not present

## 2019-06-24 DIAGNOSIS — M9901 Segmental and somatic dysfunction of cervical region: Secondary | ICD-10-CM | POA: Diagnosis not present

## 2019-06-30 ENCOUNTER — Encounter (INDEPENDENT_AMBULATORY_CARE_PROVIDER_SITE_OTHER): Payer: Self-pay | Admitting: Internal Medicine

## 2019-07-07 ENCOUNTER — Ambulatory Visit (INDEPENDENT_AMBULATORY_CARE_PROVIDER_SITE_OTHER): Payer: BC Managed Care – PPO | Admitting: Internal Medicine

## 2019-07-10 DIAGNOSIS — M9903 Segmental and somatic dysfunction of lumbar region: Secondary | ICD-10-CM | POA: Diagnosis not present

## 2019-07-10 DIAGNOSIS — M5411 Radiculopathy, occipito-atlanto-axial region: Secondary | ICD-10-CM | POA: Diagnosis not present

## 2019-07-10 DIAGNOSIS — M9901 Segmental and somatic dysfunction of cervical region: Secondary | ICD-10-CM | POA: Diagnosis not present

## 2019-07-22 DIAGNOSIS — K900 Celiac disease: Secondary | ICD-10-CM | POA: Diagnosis not present

## 2019-07-22 DIAGNOSIS — K5900 Constipation, unspecified: Secondary | ICD-10-CM | POA: Diagnosis not present

## 2019-07-22 DIAGNOSIS — E739 Lactose intolerance, unspecified: Secondary | ICD-10-CM | POA: Diagnosis not present

## 2019-07-22 DIAGNOSIS — Z1211 Encounter for screening for malignant neoplasm of colon: Secondary | ICD-10-CM | POA: Diagnosis not present

## 2019-07-28 DIAGNOSIS — L309 Dermatitis, unspecified: Secondary | ICD-10-CM | POA: Diagnosis not present

## 2019-08-04 DIAGNOSIS — M5411 Radiculopathy, occipito-atlanto-axial region: Secondary | ICD-10-CM | POA: Diagnosis not present

## 2019-08-04 DIAGNOSIS — M9901 Segmental and somatic dysfunction of cervical region: Secondary | ICD-10-CM | POA: Diagnosis not present

## 2019-08-04 DIAGNOSIS — M9903 Segmental and somatic dysfunction of lumbar region: Secondary | ICD-10-CM | POA: Diagnosis not present

## 2019-08-10 IMAGING — US US THYROID
1 series · 14 of 25 positions shown · non-contrast
Comparison: 10/17/2016

CLINICAL DATA: Goiter

EXAM:
THYROID ULTRASOUND
TECHNIQUE: Ultrasound examination of the thyroid gland and adjacent soft
tissues was performed.

[Series 1: us thyroid · 0.06mm/px · 14 of 41 slices shown]
[im 1/41]
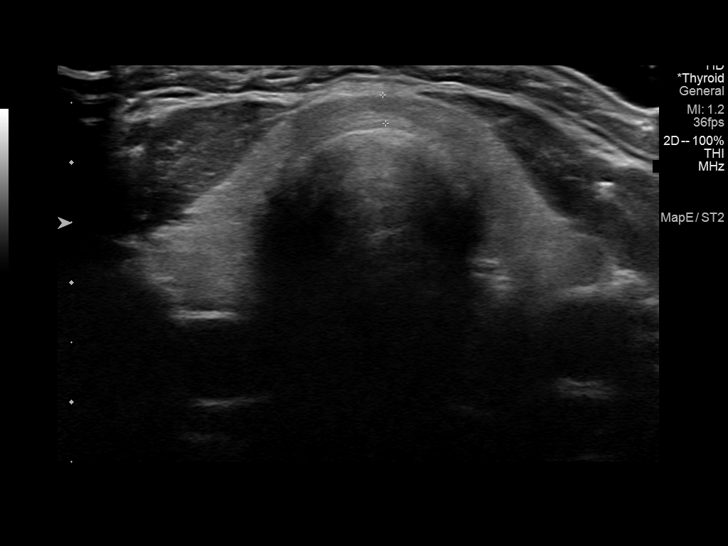
[im 4/41]
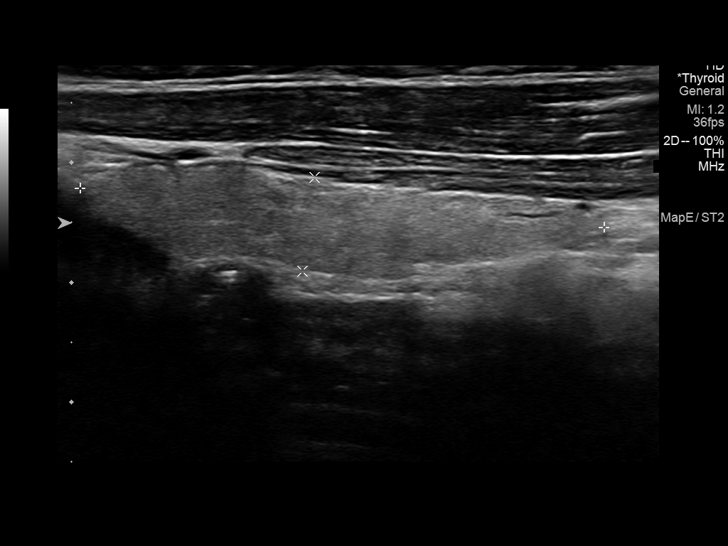
[im 7/41]
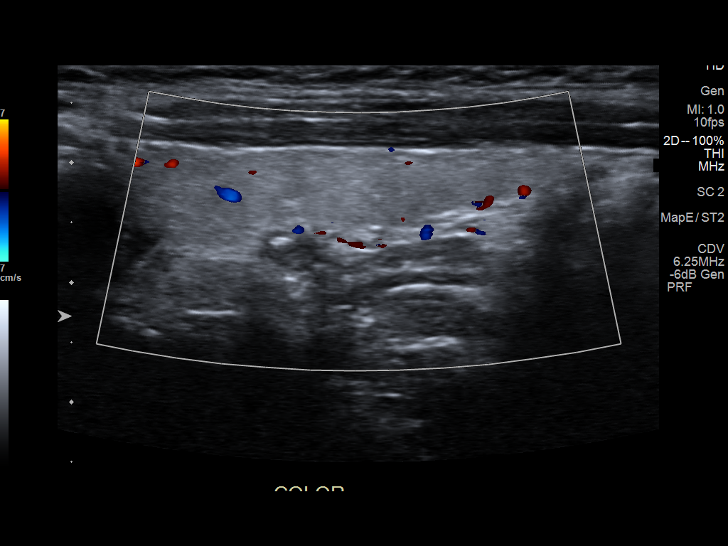
[im 11/41]
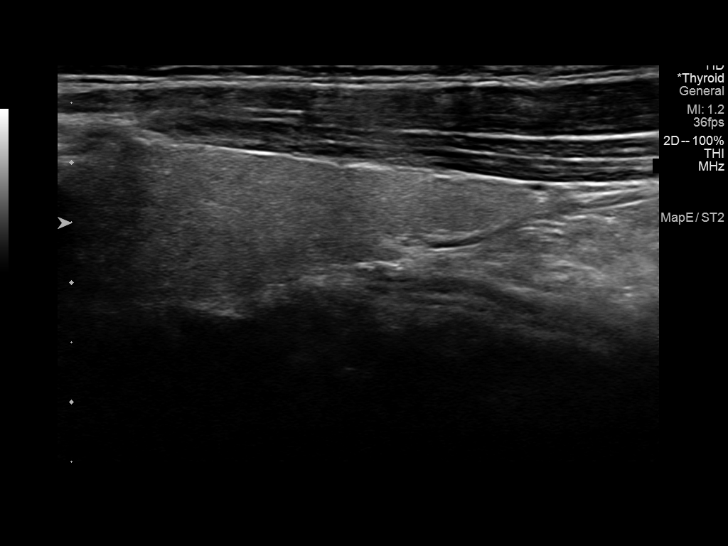
[im 14/41]
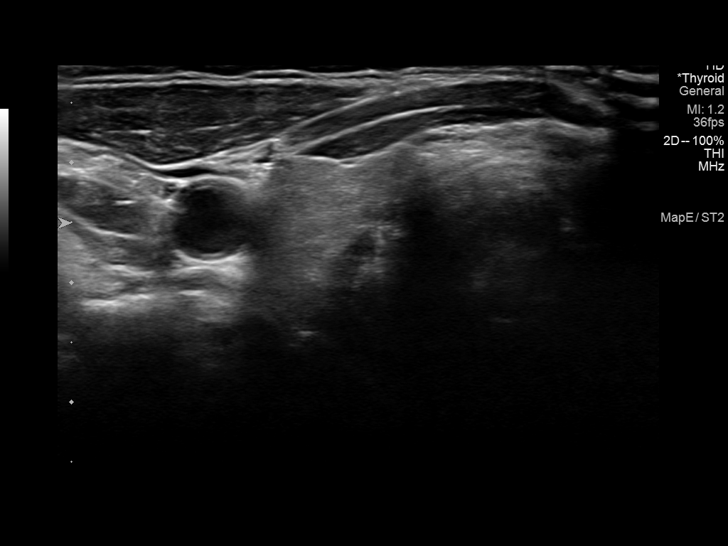
[im 16/41]
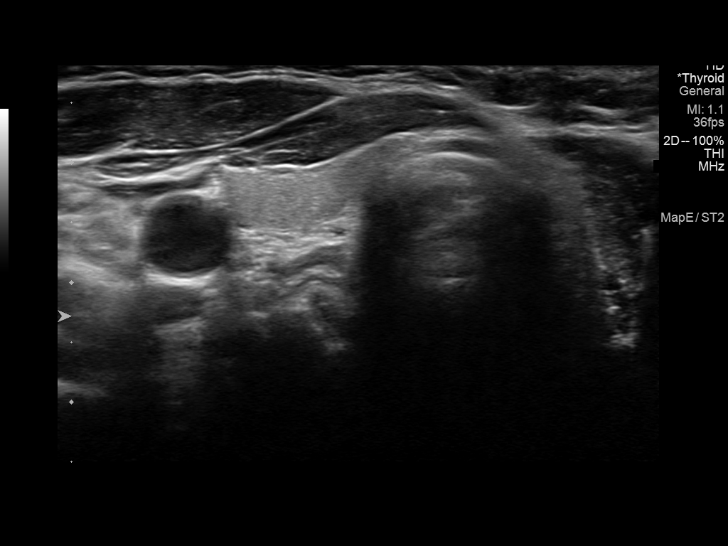
[im 19/41]
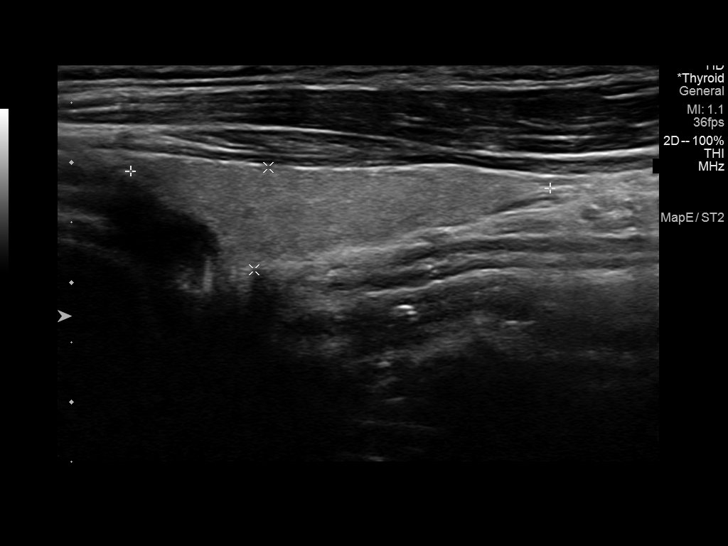
[im 22/41]
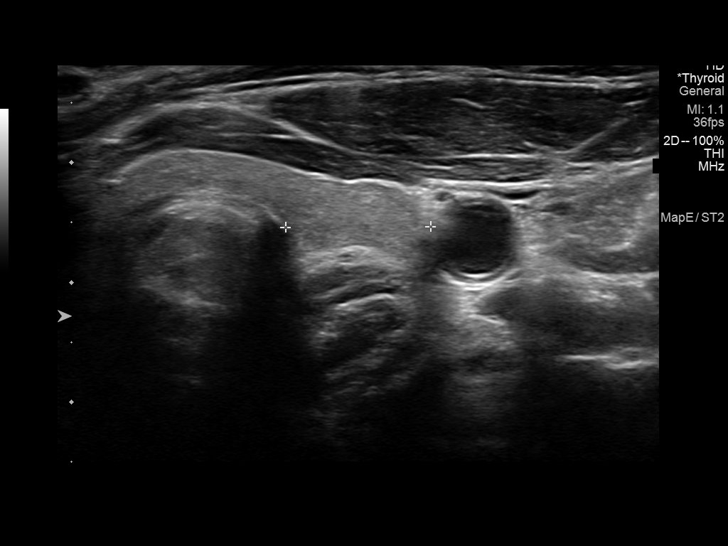
[im 26/41]
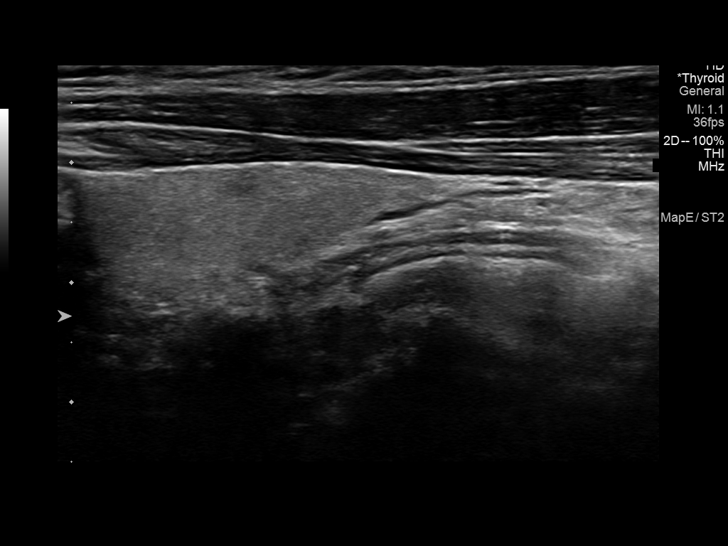
[im 27/41]
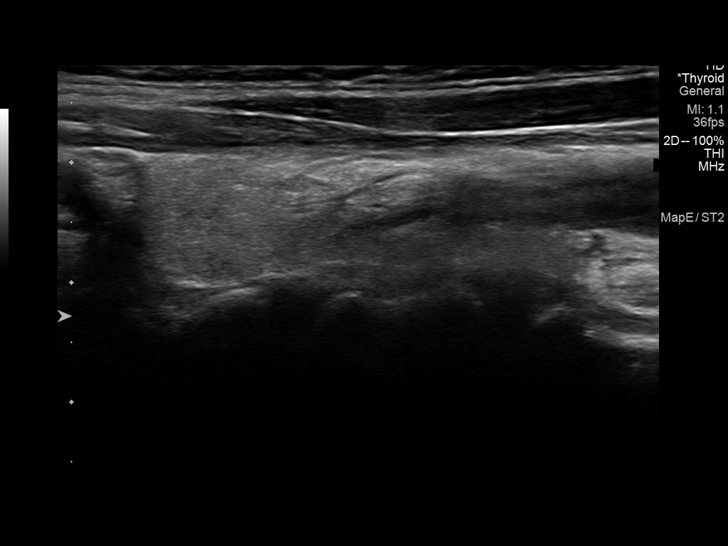
[im 31/41]
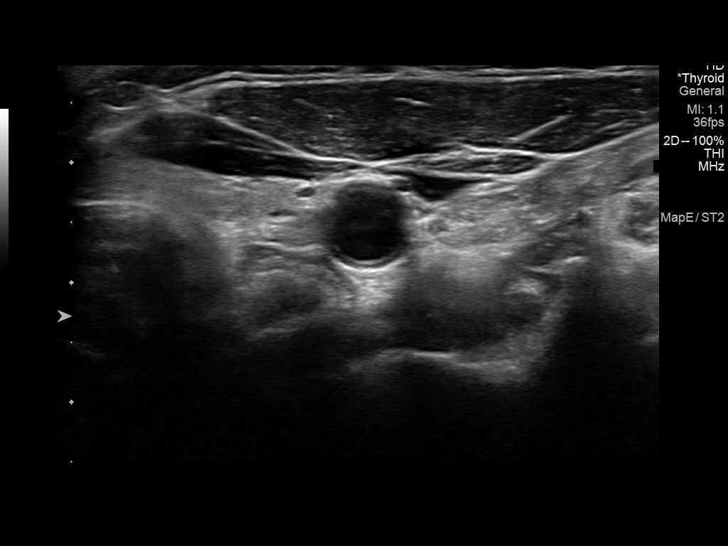
[im 34/41]
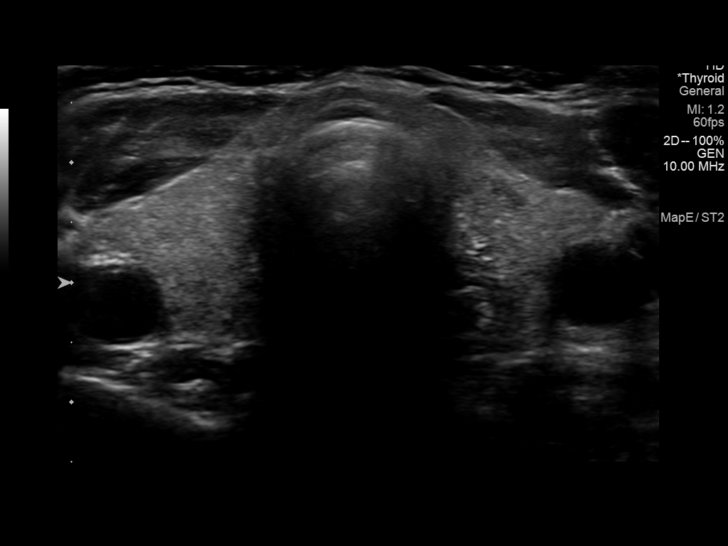
[im 37/41]
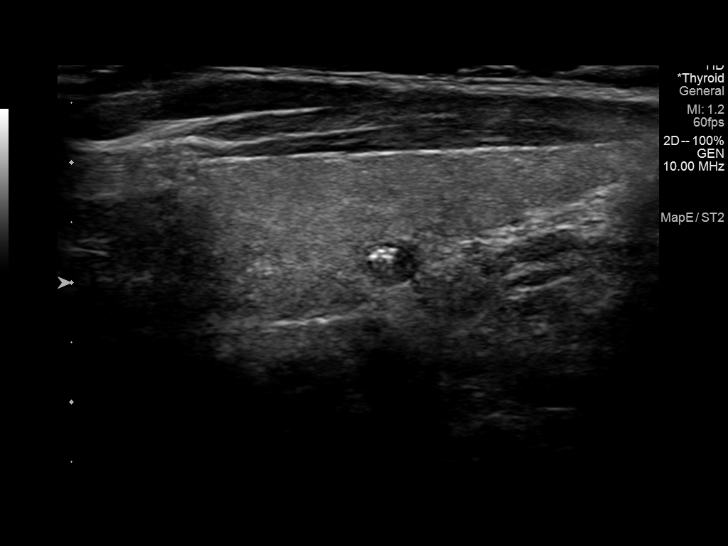
[im 41/41]
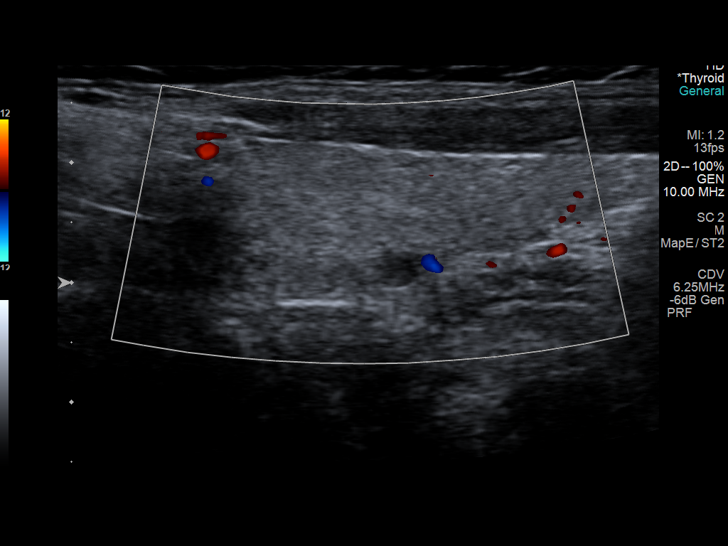

[14 of 25 positions shown; findings below may reference images not displayed]

FINDINGS: Parenchymal Echotexture: Normal

Isthmus: 2 mm, unchanged

Right lobe: 4.4 x 0.8 x 1.2 cm, previously 4.3 x 1.0 x 1.2 cm

Left lobe: 3.5 x 0.9 x 1.2 cm, previously 3.0 x 1.1 x 1.5 cm

_________________________________________________________

Estimated total number of nodules >/= 1 cm: 0

Number of spongiform nodules >/=  2 cm not described below (TR1): 0

Number of mixed cystic and solid nodules >/= 1.5 cm not described
below (TR2): 0

_________________________________________________________

Normal thyroid echotexture and vascularity.

Subcentimeter right mid thyroid hypoechoic nodule with dystrophic
calcification measuring only 4 mm. This would not meet criteria for
any biopsy or follow-up. No regional adenopathy.
IMPRESSION: No significant thyroid abnormality by ultrasound. Subcentimeter
right mid thyroid nodule noted as above.

The above is in keeping with the ACR TI-RADS recommendations - [HOSPITAL] 9929;[DATE].

## 2019-08-27 DIAGNOSIS — L814 Other melanin hyperpigmentation: Secondary | ICD-10-CM | POA: Diagnosis not present

## 2019-08-27 DIAGNOSIS — L821 Other seborrheic keratosis: Secondary | ICD-10-CM | POA: Diagnosis not present

## 2019-08-27 DIAGNOSIS — Z85820 Personal history of malignant melanoma of skin: Secondary | ICD-10-CM | POA: Diagnosis not present

## 2019-08-27 DIAGNOSIS — D225 Melanocytic nevi of trunk: Secondary | ICD-10-CM | POA: Diagnosis not present

## 2019-08-29 DIAGNOSIS — Z1211 Encounter for screening for malignant neoplasm of colon: Secondary | ICD-10-CM | POA: Diagnosis not present

## 2019-08-29 DIAGNOSIS — D122 Benign neoplasm of ascending colon: Secondary | ICD-10-CM | POA: Diagnosis not present

## 2019-08-29 DIAGNOSIS — D125 Benign neoplasm of sigmoid colon: Secondary | ICD-10-CM | POA: Diagnosis not present

## 2019-08-29 DIAGNOSIS — K635 Polyp of colon: Secondary | ICD-10-CM | POA: Diagnosis not present

## 2019-09-04 DIAGNOSIS — M9903 Segmental and somatic dysfunction of lumbar region: Secondary | ICD-10-CM | POA: Diagnosis not present

## 2019-09-04 DIAGNOSIS — M9901 Segmental and somatic dysfunction of cervical region: Secondary | ICD-10-CM | POA: Diagnosis not present

## 2019-09-04 DIAGNOSIS — M5411 Radiculopathy, occipito-atlanto-axial region: Secondary | ICD-10-CM | POA: Diagnosis not present

## 2019-09-29 DIAGNOSIS — M5411 Radiculopathy, occipito-atlanto-axial region: Secondary | ICD-10-CM | POA: Diagnosis not present

## 2019-09-29 DIAGNOSIS — M9901 Segmental and somatic dysfunction of cervical region: Secondary | ICD-10-CM | POA: Diagnosis not present

## 2019-09-29 DIAGNOSIS — M9903 Segmental and somatic dysfunction of lumbar region: Secondary | ICD-10-CM | POA: Diagnosis not present

## 2019-10-15 DIAGNOSIS — M9901 Segmental and somatic dysfunction of cervical region: Secondary | ICD-10-CM | POA: Diagnosis not present

## 2019-10-15 DIAGNOSIS — M9903 Segmental and somatic dysfunction of lumbar region: Secondary | ICD-10-CM | POA: Diagnosis not present

## 2019-10-15 DIAGNOSIS — M5411 Radiculopathy, occipito-atlanto-axial region: Secondary | ICD-10-CM | POA: Diagnosis not present

## 2019-11-10 ENCOUNTER — Other Ambulatory Visit (INDEPENDENT_AMBULATORY_CARE_PROVIDER_SITE_OTHER): Payer: Self-pay

## 2019-11-11 ENCOUNTER — Other Ambulatory Visit (INDEPENDENT_AMBULATORY_CARE_PROVIDER_SITE_OTHER): Payer: Self-pay | Admitting: Internal Medicine

## 2019-11-11 DIAGNOSIS — E039 Hypothyroidism, unspecified: Secondary | ICD-10-CM

## 2019-11-11 NOTE — Telephone Encounter (Signed)
refill 

## 2019-11-12 ENCOUNTER — Other Ambulatory Visit (INDEPENDENT_AMBULATORY_CARE_PROVIDER_SITE_OTHER): Payer: Self-pay | Admitting: Internal Medicine

## 2019-11-12 ENCOUNTER — Encounter (INDEPENDENT_AMBULATORY_CARE_PROVIDER_SITE_OTHER): Payer: Self-pay | Admitting: Internal Medicine

## 2019-11-12 ENCOUNTER — Other Ambulatory Visit (INDEPENDENT_AMBULATORY_CARE_PROVIDER_SITE_OTHER): Payer: Self-pay | Admitting: Nurse Practitioner

## 2019-11-12 DIAGNOSIS — E039 Hypothyroidism, unspecified: Secondary | ICD-10-CM

## 2019-11-17 ENCOUNTER — Other Ambulatory Visit (INDEPENDENT_AMBULATORY_CARE_PROVIDER_SITE_OTHER): Payer: Self-pay

## 2019-11-17 ENCOUNTER — Telehealth (INDEPENDENT_AMBULATORY_CARE_PROVIDER_SITE_OTHER): Payer: Self-pay | Admitting: Nurse Practitioner

## 2019-11-17 DIAGNOSIS — E039 Hypothyroidism, unspecified: Secondary | ICD-10-CM

## 2019-11-17 NOTE — Telephone Encounter (Signed)
Benedetto Goad, she is scheduled to see me on 8/16 regarding getting her medications refilled. She needs to be scheduled with Dr. Reece Agar. Please call her to reschedule her to be seen by Dr. Reece Agar thank you.

## 2019-11-17 NOTE — Telephone Encounter (Signed)
She is rescheduled with Dr. Reece Agar

## 2019-11-18 ENCOUNTER — Encounter (INDEPENDENT_AMBULATORY_CARE_PROVIDER_SITE_OTHER): Payer: Self-pay

## 2019-11-18 NOTE — Telephone Encounter (Signed)
I sent her a mychart message since her phone voicemail is full.

## 2019-11-18 NOTE — Telephone Encounter (Signed)
Called pt; cannot take

## 2019-11-24 ENCOUNTER — Other Ambulatory Visit (INDEPENDENT_AMBULATORY_CARE_PROVIDER_SITE_OTHER): Payer: BC Managed Care – PPO

## 2019-11-24 ENCOUNTER — Encounter (INDEPENDENT_AMBULATORY_CARE_PROVIDER_SITE_OTHER): Payer: Self-pay

## 2019-11-24 ENCOUNTER — Other Ambulatory Visit: Payer: Self-pay

## 2019-11-24 ENCOUNTER — Telehealth (INDEPENDENT_AMBULATORY_CARE_PROVIDER_SITE_OTHER): Payer: Self-pay

## 2019-11-24 DIAGNOSIS — Z01419 Encounter for gynecological examination (general) (routine) without abnormal findings: Secondary | ICD-10-CM | POA: Diagnosis not present

## 2019-11-24 DIAGNOSIS — Z1231 Encounter for screening mammogram for malignant neoplasm of breast: Secondary | ICD-10-CM | POA: Diagnosis not present

## 2019-11-24 DIAGNOSIS — Z13820 Encounter for screening for osteoporosis: Secondary | ICD-10-CM | POA: Diagnosis not present

## 2019-11-24 NOTE — Telephone Encounter (Signed)
I will not refill any of her medications until we see her in the office.

## 2019-12-01 ENCOUNTER — Ambulatory Visit (INDEPENDENT_AMBULATORY_CARE_PROVIDER_SITE_OTHER): Payer: BC Managed Care – PPO | Admitting: Nurse Practitioner

## 2019-12-08 DIAGNOSIS — R001 Bradycardia, unspecified: Secondary | ICD-10-CM | POA: Diagnosis not present

## 2019-12-08 DIAGNOSIS — R002 Palpitations: Secondary | ICD-10-CM | POA: Diagnosis not present

## 2019-12-08 DIAGNOSIS — E039 Hypothyroidism, unspecified: Secondary | ICD-10-CM | POA: Diagnosis not present

## 2019-12-08 DIAGNOSIS — M2550 Pain in unspecified joint: Secondary | ICD-10-CM | POA: Diagnosis not present

## 2019-12-10 ENCOUNTER — Other Ambulatory Visit: Payer: Self-pay

## 2019-12-10 ENCOUNTER — Encounter (INDEPENDENT_AMBULATORY_CARE_PROVIDER_SITE_OTHER): Payer: Self-pay | Admitting: Internal Medicine

## 2019-12-10 ENCOUNTER — Ambulatory Visit (INDEPENDENT_AMBULATORY_CARE_PROVIDER_SITE_OTHER): Payer: BC Managed Care – PPO | Admitting: Internal Medicine

## 2019-12-10 VITALS — BP 110/70 | HR 75 | Temp 97.4°F | Resp 18 | Ht 64.0 in | Wt 182.0 lb

## 2019-12-10 DIAGNOSIS — E2839 Other primary ovarian failure: Secondary | ICD-10-CM

## 2019-12-10 DIAGNOSIS — E039 Hypothyroidism, unspecified: Secondary | ICD-10-CM | POA: Diagnosis not present

## 2019-12-10 NOTE — Progress Notes (Signed)
Metrics: Intervention Frequency ACO  Documented Smoking Status Yearly  Screened one or more times in 24 months  Cessation Counseling or  Active cessation medication Past 24 months  Past 24 months   Guideline developer: UpToDate (See UpToDate for funding source) Date Released: 2014       Wellness Office Visit  Subjective:  Patient ID: Katie Carney, female    DOB: [birth year]  Age: 54 y.o. MRN: [id number]  CC: This lady comes in for follow-up after a long hiatus.  She has a history of hypothyroidism and is on bioidentical cream of estradiol and progesterone. HPI  She continues on Armour Thyroid with a total dose of 45 mg daily. She continues on Biest cream which is a combination of estrogens and also takes Progestogen cream separately. She continues to feel tired. She saw another provider in another health system and complained of heart palpitations and fluttering and she has been referred to cardiology. Past Medical History:  Diagnosis Date  . Hypothyroidism, adult 04/09/2019  . Osteopenia after menopause 04/09/2019   Past Surgical History:  Procedure Laterality Date  . GUM SURGERY  2014  . WISDOM TOOTH EXTRACTION       Family History  Problem Relation Age of Onset  . Dementia Maternal Aunt     Social History   Social History Narrative   Married for last 15 years,second marriage.Lives with husband.   Caffeine use: Drinks organic coffee in the morning   Works in [employer] at [employer].Works from home.   Social History   Tobacco Use  . Smoking status: Former Games developer  . Smokeless tobacco: Never Used  Substance Use Topics  . Alcohol use: Yes    Alcohol/week: 1.0 - 2.0 standard drink    Types: 1 - 2 Glasses of wine per week    Current Meds  Medication Sig  . ARMOUR THYROID 15 MG tablet Take by mouth.   . Estradiol-Estriol-Progesterone (BIEST/PROGESTERONE) CREA Place onto the skin.  . Multiple Vitamin (MULTIVITAMIN) tablet Take 1 tablet by mouth.  . thyroid (ARMOUR  THYROID) 30 MG tablet Take 1 tablet (30 mg total) by mouth daily before breakfast.       Depression screen [facility] 2/9 04/09/2019  Decreased Interest 1  Down, Depressed, Hopeless 1  PHQ - 2 Score 2  Altered sleeping 0  Tired, decreased energy 1  Change in appetite 2  Feeling bad or failure about yourself  0  Trouble concentrating 1  Moving slowly or fidgety/restless 0  Suicidal thoughts 0  PHQ-9 Score 6     Objective:   Today's Vitals: BP 110/70 (BP Location: Right Arm, Patient Position: Sitting, Cuff Size: Normal)   Pulse 75   Temp (!) 97.4 F (36.3 C) (Temporal)   Resp 18   Ht 5\' 4"  (1.626 m)   Wt 182 lb (82.6 kg) Comment: 171.6lb at home  LMP 10/04/2011   SpO2 98%   BMI 31.24 kg/m  Vitals with BMI 12/10/2019 05/01/2019 04/09/2019  Height 5\' 4"  5' 4.5" 5\' 5"   Weight 182 lbs 180 lbs 6 oz 177 lbs 6 oz  BMI 31.22 30.5 29.52  Systolic 110 120 04/11/2019  Diastolic 70 70 70  Pulse 75 91 100     Physical Exam  She looks systemically well.  No new physical findings.     Assessment   1. Hypothyroidism, adult   2. Primary ovarian failure       Tests ordered Orders Placed This Encounter  Procedures  . T3, free  .  TSH  . Estradiol  . Progesterone     Plan: 1. She will continue with the current dose of Armour Thyroid and we will check T3 and TSH levels. 2. She will continue with the various creams that she is on and we will check estradiol and progesterone levels at her request. 3. Further recommendations will depend on these results and I will see her in about 3 months time for follow-up.   No orders of the defined types were placed in this encounter.   Wilson Singer, MD

## 2019-12-11 LAB — ESTRADIOL: Estradiol: 37 pg/mL

## 2019-12-11 LAB — TSH: TSH: 3.64 mIU/L (ref 0.40–4.50)

## 2019-12-11 LAB — PROGESTERONE: Progesterone: 1.2 ng/mL

## 2019-12-11 LAB — T3, FREE: T3, Free: 3.1 pg/mL (ref 2.3–4.2)

## 2019-12-17 DIAGNOSIS — E039 Hypothyroidism, unspecified: Secondary | ICD-10-CM | POA: Diagnosis not present

## 2019-12-17 DIAGNOSIS — R002 Palpitations: Secondary | ICD-10-CM | POA: Diagnosis not present

## 2020-01-01 DIAGNOSIS — M9901 Segmental and somatic dysfunction of cervical region: Secondary | ICD-10-CM | POA: Diagnosis not present

## 2020-01-01 DIAGNOSIS — M5411 Radiculopathy, occipito-atlanto-axial region: Secondary | ICD-10-CM | POA: Diagnosis not present

## 2020-01-01 DIAGNOSIS — M9903 Segmental and somatic dysfunction of lumbar region: Secondary | ICD-10-CM | POA: Diagnosis not present

## 2020-01-14 DIAGNOSIS — Z20822 Contact with and (suspected) exposure to covid-19: Secondary | ICD-10-CM | POA: Diagnosis not present

## 2020-01-14 DIAGNOSIS — R05 Cough: Secondary | ICD-10-CM | POA: Diagnosis not present

## 2020-01-14 DIAGNOSIS — R509 Fever, unspecified: Secondary | ICD-10-CM | POA: Diagnosis not present

## 2020-01-14 DIAGNOSIS — R519 Headache, unspecified: Secondary | ICD-10-CM | POA: Diagnosis not present

## 2020-01-23 DIAGNOSIS — U071 COVID-19: Secondary | ICD-10-CM | POA: Diagnosis not present

## 2020-01-29 DIAGNOSIS — M9901 Segmental and somatic dysfunction of cervical region: Secondary | ICD-10-CM | POA: Diagnosis not present

## 2020-01-29 DIAGNOSIS — M9903 Segmental and somatic dysfunction of lumbar region: Secondary | ICD-10-CM | POA: Diagnosis not present

## 2020-01-29 DIAGNOSIS — M5411 Radiculopathy, occipito-atlanto-axial region: Secondary | ICD-10-CM | POA: Diagnosis not present

## 2020-02-02 DIAGNOSIS — M9903 Segmental and somatic dysfunction of lumbar region: Secondary | ICD-10-CM | POA: Diagnosis not present

## 2020-02-02 DIAGNOSIS — M9901 Segmental and somatic dysfunction of cervical region: Secondary | ICD-10-CM | POA: Diagnosis not present

## 2020-02-02 DIAGNOSIS — M5411 Radiculopathy, occipito-atlanto-axial region: Secondary | ICD-10-CM | POA: Diagnosis not present

## 2020-02-05 DIAGNOSIS — E559 Vitamin D deficiency, unspecified: Secondary | ICD-10-CM | POA: Diagnosis not present

## 2020-02-05 DIAGNOSIS — N951 Menopausal and female climacteric states: Secondary | ICD-10-CM | POA: Diagnosis not present

## 2020-02-05 DIAGNOSIS — M5411 Radiculopathy, occipito-atlanto-axial region: Secondary | ICD-10-CM | POA: Diagnosis not present

## 2020-02-05 DIAGNOSIS — R002 Palpitations: Secondary | ICD-10-CM | POA: Diagnosis not present

## 2020-02-05 DIAGNOSIS — M9903 Segmental and somatic dysfunction of lumbar region: Secondary | ICD-10-CM | POA: Diagnosis not present

## 2020-02-05 DIAGNOSIS — M9901 Segmental and somatic dysfunction of cervical region: Secondary | ICD-10-CM | POA: Diagnosis not present

## 2020-02-05 DIAGNOSIS — R7982 Elevated C-reactive protein (CRP): Secondary | ICD-10-CM | POA: Diagnosis not present

## 2020-02-05 DIAGNOSIS — E538 Deficiency of other specified B group vitamins: Secondary | ICD-10-CM | POA: Diagnosis not present

## 2020-02-05 DIAGNOSIS — R5383 Other fatigue: Secondary | ICD-10-CM | POA: Diagnosis not present

## 2020-02-05 DIAGNOSIS — E039 Hypothyroidism, unspecified: Secondary | ICD-10-CM | POA: Diagnosis not present

## 2020-02-16 DIAGNOSIS — M5411 Radiculopathy, occipito-atlanto-axial region: Secondary | ICD-10-CM | POA: Diagnosis not present

## 2020-02-16 DIAGNOSIS — E039 Hypothyroidism, unspecified: Secondary | ICD-10-CM | POA: Diagnosis not present

## 2020-02-16 DIAGNOSIS — R002 Palpitations: Secondary | ICD-10-CM | POA: Diagnosis not present

## 2020-02-16 DIAGNOSIS — M9903 Segmental and somatic dysfunction of lumbar region: Secondary | ICD-10-CM | POA: Diagnosis not present

## 2020-02-16 DIAGNOSIS — M9901 Segmental and somatic dysfunction of cervical region: Secondary | ICD-10-CM | POA: Diagnosis not present

## 2020-02-23 DIAGNOSIS — R002 Palpitations: Secondary | ICD-10-CM | POA: Diagnosis not present

## 2020-02-23 DIAGNOSIS — E039 Hypothyroidism, unspecified: Secondary | ICD-10-CM | POA: Diagnosis not present

## 2020-02-25 DIAGNOSIS — M5411 Radiculopathy, occipito-atlanto-axial region: Secondary | ICD-10-CM | POA: Diagnosis not present

## 2020-02-25 DIAGNOSIS — M9901 Segmental and somatic dysfunction of cervical region: Secondary | ICD-10-CM | POA: Diagnosis not present

## 2020-02-25 DIAGNOSIS — M9903 Segmental and somatic dysfunction of lumbar region: Secondary | ICD-10-CM | POA: Diagnosis not present

## 2020-02-26 DIAGNOSIS — Z85820 Personal history of malignant melanoma of skin: Secondary | ICD-10-CM | POA: Diagnosis not present

## 2020-02-26 DIAGNOSIS — L821 Other seborrheic keratosis: Secondary | ICD-10-CM | POA: Diagnosis not present

## 2020-02-26 DIAGNOSIS — D2261 Melanocytic nevi of right upper limb, including shoulder: Secondary | ICD-10-CM | POA: Diagnosis not present

## 2020-02-26 DIAGNOSIS — D2262 Melanocytic nevi of left upper limb, including shoulder: Secondary | ICD-10-CM | POA: Diagnosis not present

## 2020-03-01 DIAGNOSIS — R7982 Elevated C-reactive protein (CRP): Secondary | ICD-10-CM | POA: Diagnosis not present

## 2020-03-01 DIAGNOSIS — N951 Menopausal and female climacteric states: Secondary | ICD-10-CM | POA: Diagnosis not present

## 2020-03-01 DIAGNOSIS — E039 Hypothyroidism, unspecified: Secondary | ICD-10-CM | POA: Diagnosis not present

## 2020-03-01 DIAGNOSIS — R5383 Other fatigue: Secondary | ICD-10-CM | POA: Diagnosis not present

## 2020-03-10 DIAGNOSIS — M9901 Segmental and somatic dysfunction of cervical region: Secondary | ICD-10-CM | POA: Diagnosis not present

## 2020-03-10 DIAGNOSIS — E039 Hypothyroidism, unspecified: Secondary | ICD-10-CM | POA: Diagnosis not present

## 2020-03-10 DIAGNOSIS — M9903 Segmental and somatic dysfunction of lumbar region: Secondary | ICD-10-CM | POA: Diagnosis not present

## 2020-03-10 DIAGNOSIS — R002 Palpitations: Secondary | ICD-10-CM | POA: Diagnosis not present

## 2020-03-10 DIAGNOSIS — M5411 Radiculopathy, occipito-atlanto-axial region: Secondary | ICD-10-CM | POA: Diagnosis not present

## 2020-03-16 DIAGNOSIS — M25512 Pain in left shoulder: Secondary | ICD-10-CM | POA: Diagnosis not present

## 2020-03-22 DIAGNOSIS — M25512 Pain in left shoulder: Secondary | ICD-10-CM | POA: Diagnosis not present

## 2020-03-24 ENCOUNTER — Ambulatory Visit (INDEPENDENT_AMBULATORY_CARE_PROVIDER_SITE_OTHER): Payer: BC Managed Care – PPO | Admitting: Internal Medicine

## 2020-03-25 DIAGNOSIS — M25512 Pain in left shoulder: Secondary | ICD-10-CM | POA: Diagnosis not present

## 2020-03-29 DIAGNOSIS — M25512 Pain in left shoulder: Secondary | ICD-10-CM | POA: Diagnosis not present

## 2020-03-30 DIAGNOSIS — H04123 Dry eye syndrome of bilateral lacrimal glands: Secondary | ICD-10-CM | POA: Diagnosis not present

## 2020-03-30 DIAGNOSIS — B029 Zoster without complications: Secondary | ICD-10-CM | POA: Diagnosis not present

## 2020-03-30 DIAGNOSIS — B0239 Other herpes zoster eye disease: Secondary | ICD-10-CM | POA: Diagnosis not present

## 2020-04-01 DIAGNOSIS — M25512 Pain in left shoulder: Secondary | ICD-10-CM | POA: Diagnosis not present

## 2020-04-06 DIAGNOSIS — R21 Rash and other nonspecific skin eruption: Secondary | ICD-10-CM | POA: Diagnosis not present

## 2020-04-07 DIAGNOSIS — R21 Rash and other nonspecific skin eruption: Secondary | ICD-10-CM | POA: Diagnosis not present

## 2020-04-07 DIAGNOSIS — B9711 Coxsackievirus as the cause of diseases classified elsewhere: Secondary | ICD-10-CM | POA: Diagnosis not present

## 2020-04-07 DIAGNOSIS — R519 Headache, unspecified: Secondary | ICD-10-CM | POA: Diagnosis not present

## 2020-04-07 DIAGNOSIS — R5383 Other fatigue: Secondary | ICD-10-CM | POA: Diagnosis not present

## 2020-04-15 DIAGNOSIS — M5411 Radiculopathy, occipito-atlanto-axial region: Secondary | ICD-10-CM | POA: Diagnosis not present

## 2020-04-15 DIAGNOSIS — M9901 Segmental and somatic dysfunction of cervical region: Secondary | ICD-10-CM | POA: Diagnosis not present

## 2020-04-15 DIAGNOSIS — M9903 Segmental and somatic dysfunction of lumbar region: Secondary | ICD-10-CM | POA: Diagnosis not present

## 2020-04-29 ENCOUNTER — Ambulatory Visit (INDEPENDENT_AMBULATORY_CARE_PROVIDER_SITE_OTHER): Payer: BC Managed Care – PPO | Admitting: Internal Medicine

## 2020-06-17 DIAGNOSIS — M5411 Radiculopathy, occipito-atlanto-axial region: Secondary | ICD-10-CM | POA: Diagnosis not present

## 2020-06-17 DIAGNOSIS — M9901 Segmental and somatic dysfunction of cervical region: Secondary | ICD-10-CM | POA: Diagnosis not present

## 2020-06-17 DIAGNOSIS — M9903 Segmental and somatic dysfunction of lumbar region: Secondary | ICD-10-CM | POA: Diagnosis not present

## 2020-06-30 DIAGNOSIS — M9901 Segmental and somatic dysfunction of cervical region: Secondary | ICD-10-CM | POA: Diagnosis not present

## 2020-06-30 DIAGNOSIS — M9903 Segmental and somatic dysfunction of lumbar region: Secondary | ICD-10-CM | POA: Diagnosis not present

## 2020-06-30 DIAGNOSIS — M5411 Radiculopathy, occipito-atlanto-axial region: Secondary | ICD-10-CM | POA: Diagnosis not present

## 2020-07-22 DIAGNOSIS — M9903 Segmental and somatic dysfunction of lumbar region: Secondary | ICD-10-CM | POA: Diagnosis not present

## 2020-07-22 DIAGNOSIS — M9901 Segmental and somatic dysfunction of cervical region: Secondary | ICD-10-CM | POA: Diagnosis not present

## 2020-07-22 DIAGNOSIS — M5411 Radiculopathy, occipito-atlanto-axial region: Secondary | ICD-10-CM | POA: Diagnosis not present

## 2020-07-28 DIAGNOSIS — R7982 Elevated C-reactive protein (CRP): Secondary | ICD-10-CM | POA: Diagnosis not present

## 2020-07-28 DIAGNOSIS — R5383 Other fatigue: Secondary | ICD-10-CM | POA: Diagnosis not present

## 2020-07-28 DIAGNOSIS — E039 Hypothyroidism, unspecified: Secondary | ICD-10-CM | POA: Diagnosis not present

## 2020-07-28 DIAGNOSIS — N951 Menopausal and female climacteric states: Secondary | ICD-10-CM | POA: Diagnosis not present

## 2020-07-28 DIAGNOSIS — E559 Vitamin D deficiency, unspecified: Secondary | ICD-10-CM | POA: Diagnosis not present

## 2020-07-28 DIAGNOSIS — R002 Palpitations: Secondary | ICD-10-CM | POA: Diagnosis not present

## 2020-07-29 DIAGNOSIS — L821 Other seborrheic keratosis: Secondary | ICD-10-CM | POA: Diagnosis not present

## 2020-07-29 DIAGNOSIS — L570 Actinic keratosis: Secondary | ICD-10-CM | POA: Diagnosis not present

## 2020-07-29 DIAGNOSIS — D225 Melanocytic nevi of trunk: Secondary | ICD-10-CM | POA: Diagnosis not present

## 2020-07-29 DIAGNOSIS — D2262 Melanocytic nevi of left upper limb, including shoulder: Secondary | ICD-10-CM | POA: Diagnosis not present

## 2020-07-29 DIAGNOSIS — L812 Freckles: Secondary | ICD-10-CM | POA: Diagnosis not present

## 2020-08-16 DIAGNOSIS — M5411 Radiculopathy, occipito-atlanto-axial region: Secondary | ICD-10-CM | POA: Diagnosis not present

## 2020-08-16 DIAGNOSIS — M9903 Segmental and somatic dysfunction of lumbar region: Secondary | ICD-10-CM | POA: Diagnosis not present

## 2020-08-16 DIAGNOSIS — M9901 Segmental and somatic dysfunction of cervical region: Secondary | ICD-10-CM | POA: Diagnosis not present

## 2020-09-06 DIAGNOSIS — E039 Hypothyroidism, unspecified: Secondary | ICD-10-CM | POA: Diagnosis not present

## 2020-09-06 DIAGNOSIS — R5383 Other fatigue: Secondary | ICD-10-CM | POA: Diagnosis not present

## 2020-09-06 DIAGNOSIS — N951 Menopausal and female climacteric states: Secondary | ICD-10-CM | POA: Diagnosis not present

## 2020-09-06 DIAGNOSIS — G4700 Insomnia, unspecified: Secondary | ICD-10-CM | POA: Diagnosis not present

## 2020-09-21 DIAGNOSIS — N950 Postmenopausal bleeding: Secondary | ICD-10-CM | POA: Diagnosis not present

## 2020-09-21 DIAGNOSIS — R9389 Abnormal findings on diagnostic imaging of other specified body structures: Secondary | ICD-10-CM | POA: Diagnosis not present

## 2020-09-21 DIAGNOSIS — Z79890 Hormone replacement therapy (postmenopausal): Secondary | ICD-10-CM | POA: Diagnosis not present

## 2020-10-07 DIAGNOSIS — N950 Postmenopausal bleeding: Secondary | ICD-10-CM | POA: Diagnosis not present

## 2020-10-12 DIAGNOSIS — M9901 Segmental and somatic dysfunction of cervical region: Secondary | ICD-10-CM | POA: Diagnosis not present

## 2020-10-12 DIAGNOSIS — M9903 Segmental and somatic dysfunction of lumbar region: Secondary | ICD-10-CM | POA: Diagnosis not present

## 2020-10-12 DIAGNOSIS — M5411 Radiculopathy, occipito-atlanto-axial region: Secondary | ICD-10-CM | POA: Diagnosis not present

## 2020-11-17 DIAGNOSIS — N950 Postmenopausal bleeding: Secondary | ICD-10-CM | POA: Diagnosis not present

## 2020-11-17 DIAGNOSIS — N840 Polyp of corpus uteri: Secondary | ICD-10-CM | POA: Diagnosis not present

## 2020-11-29 DIAGNOSIS — M9901 Segmental and somatic dysfunction of cervical region: Secondary | ICD-10-CM | POA: Diagnosis not present

## 2020-11-29 DIAGNOSIS — M9903 Segmental and somatic dysfunction of lumbar region: Secondary | ICD-10-CM | POA: Diagnosis not present

## 2020-11-29 DIAGNOSIS — M5411 Radiculopathy, occipito-atlanto-axial region: Secondary | ICD-10-CM | POA: Diagnosis not present

## 2020-12-15 DIAGNOSIS — M9901 Segmental and somatic dysfunction of cervical region: Secondary | ICD-10-CM | POA: Diagnosis not present

## 2020-12-15 DIAGNOSIS — M5411 Radiculopathy, occipito-atlanto-axial region: Secondary | ICD-10-CM | POA: Diagnosis not present

## 2020-12-15 DIAGNOSIS — M9903 Segmental and somatic dysfunction of lumbar region: Secondary | ICD-10-CM | POA: Diagnosis not present

## 2020-12-16 DIAGNOSIS — Z01419 Encounter for gynecological examination (general) (routine) without abnormal findings: Secondary | ICD-10-CM | POA: Diagnosis not present

## 2020-12-16 DIAGNOSIS — Z1231 Encounter for screening mammogram for malignant neoplasm of breast: Secondary | ICD-10-CM | POA: Diagnosis not present

## 2020-12-16 DIAGNOSIS — Z6832 Body mass index (BMI) 32.0-32.9, adult: Secondary | ICD-10-CM | POA: Diagnosis not present

## 2020-12-29 DIAGNOSIS — M5411 Radiculopathy, occipito-atlanto-axial region: Secondary | ICD-10-CM | POA: Diagnosis not present

## 2020-12-29 DIAGNOSIS — M9903 Segmental and somatic dysfunction of lumbar region: Secondary | ICD-10-CM | POA: Diagnosis not present

## 2020-12-29 DIAGNOSIS — M9901 Segmental and somatic dysfunction of cervical region: Secondary | ICD-10-CM | POA: Diagnosis not present

## 2021-01-12 DIAGNOSIS — M9903 Segmental and somatic dysfunction of lumbar region: Secondary | ICD-10-CM | POA: Diagnosis not present

## 2021-01-12 DIAGNOSIS — M9901 Segmental and somatic dysfunction of cervical region: Secondary | ICD-10-CM | POA: Diagnosis not present

## 2021-01-12 DIAGNOSIS — M5411 Radiculopathy, occipito-atlanto-axial region: Secondary | ICD-10-CM | POA: Diagnosis not present

## 2021-01-27 DIAGNOSIS — M9901 Segmental and somatic dysfunction of cervical region: Secondary | ICD-10-CM | POA: Diagnosis not present

## 2021-01-27 DIAGNOSIS — M9903 Segmental and somatic dysfunction of lumbar region: Secondary | ICD-10-CM | POA: Diagnosis not present

## 2021-01-27 DIAGNOSIS — M5411 Radiculopathy, occipito-atlanto-axial region: Secondary | ICD-10-CM | POA: Diagnosis not present

## 2021-02-02 DIAGNOSIS — M9901 Segmental and somatic dysfunction of cervical region: Secondary | ICD-10-CM | POA: Diagnosis not present

## 2021-02-02 DIAGNOSIS — M5411 Radiculopathy, occipito-atlanto-axial region: Secondary | ICD-10-CM | POA: Diagnosis not present

## 2021-02-02 DIAGNOSIS — M9903 Segmental and somatic dysfunction of lumbar region: Secondary | ICD-10-CM | POA: Diagnosis not present

## 2021-02-16 DIAGNOSIS — L718 Other rosacea: Secondary | ICD-10-CM | POA: Diagnosis not present

## 2021-02-16 DIAGNOSIS — L570 Actinic keratosis: Secondary | ICD-10-CM | POA: Diagnosis not present

## 2021-02-16 DIAGNOSIS — D2239 Melanocytic nevi of other parts of face: Secondary | ICD-10-CM | POA: Diagnosis not present

## 2021-02-16 DIAGNOSIS — L603 Nail dystrophy: Secondary | ICD-10-CM | POA: Diagnosis not present

## 2021-02-16 DIAGNOSIS — L821 Other seborrheic keratosis: Secondary | ICD-10-CM | POA: Diagnosis not present

## 2021-02-16 DIAGNOSIS — D485 Neoplasm of uncertain behavior of skin: Secondary | ICD-10-CM | POA: Diagnosis not present

## 2021-02-23 DIAGNOSIS — M5411 Radiculopathy, occipito-atlanto-axial region: Secondary | ICD-10-CM | POA: Diagnosis not present

## 2021-02-23 DIAGNOSIS — M9903 Segmental and somatic dysfunction of lumbar region: Secondary | ICD-10-CM | POA: Diagnosis not present

## 2021-02-23 DIAGNOSIS — M9901 Segmental and somatic dysfunction of cervical region: Secondary | ICD-10-CM | POA: Diagnosis not present

## 2021-02-25 DIAGNOSIS — Z13220 Encounter for screening for lipoid disorders: Secondary | ICD-10-CM | POA: Diagnosis not present

## 2021-02-25 DIAGNOSIS — Z0001 Encounter for general adult medical examination with abnormal findings: Secondary | ICD-10-CM | POA: Diagnosis not present

## 2021-02-25 DIAGNOSIS — Z1231 Encounter for screening mammogram for malignant neoplasm of breast: Secondary | ICD-10-CM | POA: Diagnosis not present

## 2021-02-25 DIAGNOSIS — Z0000 Encounter for general adult medical examination without abnormal findings: Secondary | ICD-10-CM | POA: Diagnosis not present

## 2021-02-25 DIAGNOSIS — E559 Vitamin D deficiency, unspecified: Secondary | ICD-10-CM | POA: Diagnosis not present

## 2021-03-16 DIAGNOSIS — M9903 Segmental and somatic dysfunction of lumbar region: Secondary | ICD-10-CM | POA: Diagnosis not present

## 2021-03-16 DIAGNOSIS — M5411 Radiculopathy, occipito-atlanto-axial region: Secondary | ICD-10-CM | POA: Diagnosis not present

## 2021-03-16 DIAGNOSIS — M9901 Segmental and somatic dysfunction of cervical region: Secondary | ICD-10-CM | POA: Diagnosis not present

## 2021-03-30 DIAGNOSIS — J069 Acute upper respiratory infection, unspecified: Secondary | ICD-10-CM | POA: Diagnosis not present

## 2021-04-04 DIAGNOSIS — M5411 Radiculopathy, occipito-atlanto-axial region: Secondary | ICD-10-CM | POA: Diagnosis not present

## 2021-04-04 DIAGNOSIS — M9901 Segmental and somatic dysfunction of cervical region: Secondary | ICD-10-CM | POA: Diagnosis not present

## 2021-04-04 DIAGNOSIS — M9903 Segmental and somatic dysfunction of lumbar region: Secondary | ICD-10-CM | POA: Diagnosis not present

## 2021-04-18 DIAGNOSIS — M5411 Radiculopathy, occipito-atlanto-axial region: Secondary | ICD-10-CM | POA: Diagnosis not present

## 2021-04-18 DIAGNOSIS — M9903 Segmental and somatic dysfunction of lumbar region: Secondary | ICD-10-CM | POA: Diagnosis not present

## 2021-04-18 DIAGNOSIS — M9901 Segmental and somatic dysfunction of cervical region: Secondary | ICD-10-CM | POA: Diagnosis not present

## 2021-04-19 DIAGNOSIS — K219 Gastro-esophageal reflux disease without esophagitis: Secondary | ICD-10-CM | POA: Diagnosis not present

## 2021-04-19 DIAGNOSIS — R079 Chest pain, unspecified: Secondary | ICD-10-CM | POA: Diagnosis not present

## 2021-04-19 DIAGNOSIS — Z86010 Personal history of colonic polyps: Secondary | ICD-10-CM | POA: Diagnosis not present

## 2021-04-19 DIAGNOSIS — R1310 Dysphagia, unspecified: Secondary | ICD-10-CM | POA: Diagnosis not present

## 2021-05-24 DIAGNOSIS — K219 Gastro-esophageal reflux disease without esophagitis: Secondary | ICD-10-CM | POA: Diagnosis not present

## 2021-05-24 DIAGNOSIS — K900 Celiac disease: Secondary | ICD-10-CM | POA: Diagnosis not present

## 2021-05-24 DIAGNOSIS — Z86010 Personal history of colonic polyps: Secondary | ICD-10-CM | POA: Diagnosis not present

## 2021-05-24 DIAGNOSIS — E739 Lactose intolerance, unspecified: Secondary | ICD-10-CM | POA: Diagnosis not present

## 2021-05-26 DIAGNOSIS — E039 Hypothyroidism, unspecified: Secondary | ICD-10-CM | POA: Diagnosis not present

## 2021-05-26 DIAGNOSIS — N951 Menopausal and female climacteric states: Secondary | ICD-10-CM | POA: Diagnosis not present

## 2021-05-26 DIAGNOSIS — E559 Vitamin D deficiency, unspecified: Secondary | ICD-10-CM | POA: Diagnosis not present

## 2021-05-26 DIAGNOSIS — R5383 Other fatigue: Secondary | ICD-10-CM | POA: Diagnosis not present

## 2021-05-26 DIAGNOSIS — D8989 Other specified disorders involving the immune mechanism, not elsewhere classified: Secondary | ICD-10-CM | POA: Diagnosis not present

## 2021-05-31 DIAGNOSIS — M9901 Segmental and somatic dysfunction of cervical region: Secondary | ICD-10-CM | POA: Diagnosis not present

## 2021-05-31 DIAGNOSIS — M9903 Segmental and somatic dysfunction of lumbar region: Secondary | ICD-10-CM | POA: Diagnosis not present

## 2021-05-31 DIAGNOSIS — M5411 Radiculopathy, occipito-atlanto-axial region: Secondary | ICD-10-CM | POA: Diagnosis not present

## 2021-06-08 DIAGNOSIS — G4700 Insomnia, unspecified: Secondary | ICD-10-CM | POA: Diagnosis not present

## 2021-06-08 DIAGNOSIS — E039 Hypothyroidism, unspecified: Secondary | ICD-10-CM | POA: Diagnosis not present

## 2021-06-08 DIAGNOSIS — R5383 Other fatigue: Secondary | ICD-10-CM | POA: Diagnosis not present

## 2021-06-08 DIAGNOSIS — N951 Menopausal and female climacteric states: Secondary | ICD-10-CM | POA: Diagnosis not present

## 2021-06-09 DIAGNOSIS — M9901 Segmental and somatic dysfunction of cervical region: Secondary | ICD-10-CM | POA: Diagnosis not present

## 2021-06-09 DIAGNOSIS — M5411 Radiculopathy, occipito-atlanto-axial region: Secondary | ICD-10-CM | POA: Diagnosis not present

## 2021-06-09 DIAGNOSIS — M9903 Segmental and somatic dysfunction of lumbar region: Secondary | ICD-10-CM | POA: Diagnosis not present

## 2021-06-30 DIAGNOSIS — M5411 Radiculopathy, occipito-atlanto-axial region: Secondary | ICD-10-CM | POA: Diagnosis not present

## 2021-06-30 DIAGNOSIS — M9903 Segmental and somatic dysfunction of lumbar region: Secondary | ICD-10-CM | POA: Diagnosis not present

## 2021-06-30 DIAGNOSIS — M9901 Segmental and somatic dysfunction of cervical region: Secondary | ICD-10-CM | POA: Diagnosis not present

## 2021-07-06 DIAGNOSIS — D220 Melanocytic nevi of lip: Secondary | ICD-10-CM | POA: Diagnosis not present

## 2021-07-06 DIAGNOSIS — L814 Other melanin hyperpigmentation: Secondary | ICD-10-CM | POA: Diagnosis not present

## 2021-07-06 DIAGNOSIS — L720 Epidermal cyst: Secondary | ICD-10-CM | POA: Diagnosis not present

## 2021-07-06 DIAGNOSIS — L821 Other seborrheic keratosis: Secondary | ICD-10-CM | POA: Diagnosis not present

## 2021-07-18 DIAGNOSIS — M9901 Segmental and somatic dysfunction of cervical region: Secondary | ICD-10-CM | POA: Diagnosis not present

## 2021-07-18 DIAGNOSIS — M9903 Segmental and somatic dysfunction of lumbar region: Secondary | ICD-10-CM | POA: Diagnosis not present

## 2021-07-18 DIAGNOSIS — M5411 Radiculopathy, occipito-atlanto-axial region: Secondary | ICD-10-CM | POA: Diagnosis not present

## 2021-08-01 DIAGNOSIS — M9901 Segmental and somatic dysfunction of cervical region: Secondary | ICD-10-CM | POA: Diagnosis not present

## 2021-08-01 DIAGNOSIS — M5411 Radiculopathy, occipito-atlanto-axial region: Secondary | ICD-10-CM | POA: Diagnosis not present

## 2021-08-01 DIAGNOSIS — M9903 Segmental and somatic dysfunction of lumbar region: Secondary | ICD-10-CM | POA: Diagnosis not present

## 2021-08-31 DIAGNOSIS — M5411 Radiculopathy, occipito-atlanto-axial region: Secondary | ICD-10-CM | POA: Diagnosis not present

## 2021-08-31 DIAGNOSIS — M9901 Segmental and somatic dysfunction of cervical region: Secondary | ICD-10-CM | POA: Diagnosis not present

## 2021-08-31 DIAGNOSIS — M9903 Segmental and somatic dysfunction of lumbar region: Secondary | ICD-10-CM | POA: Diagnosis not present

## 2021-09-14 DIAGNOSIS — M9903 Segmental and somatic dysfunction of lumbar region: Secondary | ICD-10-CM | POA: Diagnosis not present

## 2021-09-14 DIAGNOSIS — M9901 Segmental and somatic dysfunction of cervical region: Secondary | ICD-10-CM | POA: Diagnosis not present

## 2021-09-14 DIAGNOSIS — M5411 Radiculopathy, occipito-atlanto-axial region: Secondary | ICD-10-CM | POA: Diagnosis not present

## 2021-09-20 DIAGNOSIS — M9903 Segmental and somatic dysfunction of lumbar region: Secondary | ICD-10-CM | POA: Diagnosis not present

## 2021-09-20 DIAGNOSIS — M9901 Segmental and somatic dysfunction of cervical region: Secondary | ICD-10-CM | POA: Diagnosis not present

## 2021-09-20 DIAGNOSIS — M5411 Radiculopathy, occipito-atlanto-axial region: Secondary | ICD-10-CM | POA: Diagnosis not present

## 2021-10-04 DIAGNOSIS — M5411 Radiculopathy, occipito-atlanto-axial region: Secondary | ICD-10-CM | POA: Diagnosis not present

## 2021-10-04 DIAGNOSIS — M9901 Segmental and somatic dysfunction of cervical region: Secondary | ICD-10-CM | POA: Diagnosis not present

## 2021-10-04 DIAGNOSIS — M9903 Segmental and somatic dysfunction of lumbar region: Secondary | ICD-10-CM | POA: Diagnosis not present

## 2021-10-20 DIAGNOSIS — M9903 Segmental and somatic dysfunction of lumbar region: Secondary | ICD-10-CM | POA: Diagnosis not present

## 2021-10-20 DIAGNOSIS — M9901 Segmental and somatic dysfunction of cervical region: Secondary | ICD-10-CM | POA: Diagnosis not present

## 2021-10-20 DIAGNOSIS — M5411 Radiculopathy, occipito-atlanto-axial region: Secondary | ICD-10-CM | POA: Diagnosis not present

## 2021-11-01 DIAGNOSIS — M9903 Segmental and somatic dysfunction of lumbar region: Secondary | ICD-10-CM | POA: Diagnosis not present

## 2021-11-01 DIAGNOSIS — M9901 Segmental and somatic dysfunction of cervical region: Secondary | ICD-10-CM | POA: Diagnosis not present

## 2021-11-01 DIAGNOSIS — M5411 Radiculopathy, occipito-atlanto-axial region: Secondary | ICD-10-CM | POA: Diagnosis not present

## 2021-11-22 DIAGNOSIS — M9903 Segmental and somatic dysfunction of lumbar region: Secondary | ICD-10-CM | POA: Diagnosis not present

## 2021-11-22 DIAGNOSIS — M5411 Radiculopathy, occipito-atlanto-axial region: Secondary | ICD-10-CM | POA: Diagnosis not present

## 2021-11-22 DIAGNOSIS — M9901 Segmental and somatic dysfunction of cervical region: Secondary | ICD-10-CM | POA: Diagnosis not present

## 2021-12-07 DIAGNOSIS — M5411 Radiculopathy, occipito-atlanto-axial region: Secondary | ICD-10-CM | POA: Diagnosis not present

## 2021-12-07 DIAGNOSIS — M9901 Segmental and somatic dysfunction of cervical region: Secondary | ICD-10-CM | POA: Diagnosis not present

## 2021-12-07 DIAGNOSIS — M9903 Segmental and somatic dysfunction of lumbar region: Secondary | ICD-10-CM | POA: Diagnosis not present

## 2021-12-14 DIAGNOSIS — M5411 Radiculopathy, occipito-atlanto-axial region: Secondary | ICD-10-CM | POA: Diagnosis not present

## 2021-12-14 DIAGNOSIS — M9901 Segmental and somatic dysfunction of cervical region: Secondary | ICD-10-CM | POA: Diagnosis not present

## 2021-12-14 DIAGNOSIS — M9903 Segmental and somatic dysfunction of lumbar region: Secondary | ICD-10-CM | POA: Diagnosis not present

## 2021-12-20 ENCOUNTER — Ambulatory Visit: Payer: BC Managed Care – PPO | Admitting: Internal Medicine

## 2021-12-28 DIAGNOSIS — M5411 Radiculopathy, occipito-atlanto-axial region: Secondary | ICD-10-CM | POA: Diagnosis not present

## 2021-12-28 DIAGNOSIS — M9903 Segmental and somatic dysfunction of lumbar region: Secondary | ICD-10-CM | POA: Diagnosis not present

## 2021-12-28 DIAGNOSIS — M9901 Segmental and somatic dysfunction of cervical region: Secondary | ICD-10-CM | POA: Diagnosis not present

## 2021-12-28 NOTE — Progress Notes (Unsigned)
Name: Katie Carney  MRN/ DOB: [id number], 05-25-1965    Age/ Sex: 56 y.o., female    PCP: Wilson Singer, MD (Inactive)   Reason for Endocrinology Evaluation: Hypothyroidism     Date of Initial Endocrinology Evaluation: 12/29/2021     HPI: Katie Carney is a 56 y.o. female with a past medical history of hypothyroidism and osteopenia . The patient presented for initial endocrinology clinic visit on 12/29/2021 for consultative assistance with her Hypothyroidism    Patient has been following up with [facility] for many years.  In 2010 the patient was going through perimenopause as well as social circumstances and was feeling irritable and not herself, and was started on Armour Thyroid at the time with a TSH of 4.9 u IU/mL.  She was subsequently started on compounded hormones and she was on this from 2015 until the summer off 2022  She was briefly seen by Dr. Karilyn Cota and 2020 and he continued her Armour Thyroid as well as by identical hormones  The patient has been following up with [facility], Armour Thyroid was increased from 45 to 60 mg, without changes in the way that she feels   [facility] (Dr. Vincente Poli), she was getting compounded hormones through them, but when she stopped it in the summer 2022 due to urinary discoloration requiring further investigation   She is currently on herbal supplements instead of hormone replacement therapy  Per patient she has been tested twice for thyroid antibodies with negative results  She has been noted with weight gain, she is motivated to increase her exercise She does try to avoid wheat products  Of note the patient has had cardiology evaluation for chronic palpitations, but these have been resolved   LMP 10/2013   NO Biotin intake  NO Fh of breast /ovarian cancer  NO Fh of clotting disorder  No Fh of thyroid disease      HISTORY:  Past Medical History:  Past Medical  History:  Diagnosis Date   Hypothyroidism, adult 04/09/2019   Osteopenia after menopause 04/09/2019   Past Surgical History:  Past Surgical History:  Procedure Laterality Date   GUM SURGERY  2014   WISDOM TOOTH EXTRACTION      Social History:  reports that she has quit smoking. She has never used smokeless tobacco. She reports current alcohol use of about 1.0 - 2.0 standard drink of alcohol per week. She reports that she does not use drugs. Family History: family history includes Dementia in her maternal aunt.   HOME MEDICATIONS: Allergies as of 12/29/2021       Reactions   Penicillins         Medication List        Accurate as of December 29, 2021 10:58 AM. If you have any questions, ask your nurse or doctor.          Biest/Progesterone Crea Place onto the skin.   multivitamin tablet Take 1 tablet by mouth.   thyroid 60 MG tablet Commonly known as: ARMOUR Take 60 mg by mouth daily before breakfast. What changed: Another medication with the same name was removed. Continue taking this medication, and follow the directions you see here. Changed by: Scarlette Shorts, MD          REVIEW OF SYSTEMS: A comprehensive ROS was conducted with the patient and is negative except as per HPI     OBJECTIVE:  VS: BP 118/70 (BP  Location: Left Arm, Patient Position: Sitting, Cuff Size: Large)   Pulse 72   Ht 5\' 4"  (1.626 m)   Wt 194 lb 12.8 oz (88.4 kg)   LMP 10/04/2011   SpO2 98%   BMI 33.44 kg/m    Wt Readings from Last 3 Encounters:  12/29/21 194 lb 12.8 oz (88.4 kg)  12/10/19 182 lb (82.6 kg)  12/11/19 174 lb 9.6 oz (79.2 kg)     EXAM: General: Pt appears well and is in NAD  Eyes: External eye exam normal without stare, lid lag or exophthalmos.  EOM intact.  .  Neck: General: Supple without adenopathy. Thyroid: Thyroid size normal.  No goiter or nodules appreciated. No thyroid bruit.  Lungs: Clear with good BS bilat with no rales, rhonchi, or  wheezes  Heart: Auscultation: RRR.  Abdomen: Normoactive bowel sounds, soft, nontender, without masses or organomegaly palpable  Extremities:  BL LE: No pretibial edema normal ROM and strength.  Mental Status: Judgment, insight: Intact Orientation: Oriented to time, place, and person Mood and affect: No depression, anxiety, or agitation     DATA REVIEWED: ***    Thyroid Ultrasound 11/20/2018 No significant thyroid abnormality by ultrasound. Subcentimeter right mid thyroid nodule noted as above.   ASSESSMENT/PLAN/RECOMMENDATIONS:   Hypothyroidism:  -Patient is clinically euthyroid -No local neck symptoms, she has had multiple thyroid ultrasounds in the past, with the latest one in 2020 did not show any significant abnormalities except for subcentimeter nodule that did not require serial monitoring - I have discussed the preference of Levothyroxine rather then armour thyroid, due to more stability with T4 content in levothyroxine and also armour thyroid has non-physiologic levels of T4:T3 of  4:1 (physiologic levels 13:1 to 16:1) -I also spent time discussing with the patient the difference between traditional endocrinology practice versus a holistic practice   Medications : Armour Thyroid 60 mg daily   2.  Postmenopausal:  -I have recommended against any hormone replacement therapy, as the risk outweighs the benefits at this time -She already has been on topical hormone replacement therapy for approximately 7 years before discontinuation   3.  Weight gain:  -She is motivated to increase her exercise -I did advise the patient to follow a certain diet such as weight watchers or Noom for at least 6 months -I will be happy to refer to weight loss clinic if her efforts are not fruitful   I spent 45 minutes preparing to see the patient by review of recent labs, imaging and procedures, obtaining and reviewing separately obtained history, communicating with the patient/family or  caregiver, ordering medications, tests or procedures, and documenting clinical information in the EHR including the differential Dx, treatment, and any further evaluation and other management    F/U in 4 months   Signed electronically by: 2021, MD  [facility] Endocrinology  [facility] [address] [place], [place] Phone: [phone] FAX: [phone]   CC: [phone], MD (Inactive) No address on file Phone: None Fax: None   Return to Endocrinology clinic as below: Future Appointments  Date Time Provider Department Center  12/29/2021 11:10 AM Riyanshi Wahab, 12/31/2021, MD [facility] None

## 2021-12-29 ENCOUNTER — Encounter: Payer: Self-pay | Admitting: Internal Medicine

## 2021-12-29 ENCOUNTER — Ambulatory Visit (INDEPENDENT_AMBULATORY_CARE_PROVIDER_SITE_OTHER): Payer: BC Managed Care – PPO | Admitting: Internal Medicine

## 2021-12-29 VITALS — BP 118/70 | HR 72 | Ht 64.0 in | Wt 194.8 lb

## 2021-12-29 DIAGNOSIS — R635 Abnormal weight gain: Secondary | ICD-10-CM | POA: Insufficient documentation

## 2021-12-29 DIAGNOSIS — Z780 Asymptomatic menopausal state: Secondary | ICD-10-CM | POA: Insufficient documentation

## 2021-12-29 DIAGNOSIS — E039 Hypothyroidism, unspecified: Secondary | ICD-10-CM | POA: Diagnosis not present

## 2021-12-29 LAB — TSH: TSH: 2.01 u[IU]/mL (ref 0.35–5.50)

## 2021-12-29 LAB — T4, FREE: Free T4: 0.78 ng/dL (ref 0.60–1.60)

## 2022-01-02 DIAGNOSIS — M5411 Radiculopathy, occipito-atlanto-axial region: Secondary | ICD-10-CM | POA: Diagnosis not present

## 2022-01-02 DIAGNOSIS — M9903 Segmental and somatic dysfunction of lumbar region: Secondary | ICD-10-CM | POA: Diagnosis not present

## 2022-01-02 DIAGNOSIS — M9901 Segmental and somatic dysfunction of cervical region: Secondary | ICD-10-CM | POA: Diagnosis not present

## 2022-01-05 DIAGNOSIS — Z1231 Encounter for screening mammogram for malignant neoplasm of breast: Secondary | ICD-10-CM | POA: Diagnosis not present

## 2022-01-05 DIAGNOSIS — Z13820 Encounter for screening for osteoporosis: Secondary | ICD-10-CM | POA: Diagnosis not present

## 2022-01-12 DIAGNOSIS — M9901 Segmental and somatic dysfunction of cervical region: Secondary | ICD-10-CM | POA: Diagnosis not present

## 2022-01-12 DIAGNOSIS — M9903 Segmental and somatic dysfunction of lumbar region: Secondary | ICD-10-CM | POA: Diagnosis not present

## 2022-01-12 DIAGNOSIS — M5411 Radiculopathy, occipito-atlanto-axial region: Secondary | ICD-10-CM | POA: Diagnosis not present

## 2022-01-25 DIAGNOSIS — M5411 Radiculopathy, occipito-atlanto-axial region: Secondary | ICD-10-CM | POA: Diagnosis not present

## 2022-01-25 DIAGNOSIS — M9903 Segmental and somatic dysfunction of lumbar region: Secondary | ICD-10-CM | POA: Diagnosis not present

## 2022-01-25 DIAGNOSIS — M9901 Segmental and somatic dysfunction of cervical region: Secondary | ICD-10-CM | POA: Diagnosis not present

## 2022-02-03 DIAGNOSIS — Z124 Encounter for screening for malignant neoplasm of cervix: Secondary | ICD-10-CM | POA: Diagnosis not present

## 2022-02-03 DIAGNOSIS — Z01419 Encounter for gynecological examination (general) (routine) without abnormal findings: Secondary | ICD-10-CM | POA: Diagnosis not present

## 2022-02-03 DIAGNOSIS — Z6833 Body mass index (BMI) 33.0-33.9, adult: Secondary | ICD-10-CM | POA: Diagnosis not present

## 2022-02-06 DIAGNOSIS — E669 Obesity, unspecified: Secondary | ICD-10-CM | POA: Diagnosis not present

## 2022-02-06 DIAGNOSIS — E782 Mixed hyperlipidemia: Secondary | ICD-10-CM | POA: Diagnosis not present

## 2022-02-06 DIAGNOSIS — R5383 Other fatigue: Secondary | ICD-10-CM | POA: Diagnosis not present

## 2022-02-06 DIAGNOSIS — E559 Vitamin D deficiency, unspecified: Secondary | ICD-10-CM | POA: Diagnosis not present

## 2022-02-06 DIAGNOSIS — N951 Menopausal and female climacteric states: Secondary | ICD-10-CM | POA: Diagnosis not present

## 2022-02-06 DIAGNOSIS — G4700 Insomnia, unspecified: Secondary | ICD-10-CM | POA: Diagnosis not present

## 2022-02-06 DIAGNOSIS — E039 Hypothyroidism, unspecified: Secondary | ICD-10-CM | POA: Diagnosis not present

## 2022-02-15 DIAGNOSIS — M9901 Segmental and somatic dysfunction of cervical region: Secondary | ICD-10-CM | POA: Diagnosis not present

## 2022-02-15 DIAGNOSIS — M9903 Segmental and somatic dysfunction of lumbar region: Secondary | ICD-10-CM | POA: Diagnosis not present

## 2022-02-15 DIAGNOSIS — M5411 Radiculopathy, occipito-atlanto-axial region: Secondary | ICD-10-CM | POA: Diagnosis not present

## 2022-02-21 DIAGNOSIS — L821 Other seborrheic keratosis: Secondary | ICD-10-CM | POA: Diagnosis not present

## 2022-02-21 DIAGNOSIS — C4371 Malignant melanoma of right lower limb, including hip: Secondary | ICD-10-CM | POA: Diagnosis not present

## 2022-02-21 DIAGNOSIS — D2262 Melanocytic nevi of left upper limb, including shoulder: Secondary | ICD-10-CM | POA: Diagnosis not present

## 2022-02-21 DIAGNOSIS — Z85820 Personal history of malignant melanoma of skin: Secondary | ICD-10-CM | POA: Diagnosis not present

## 2022-02-21 DIAGNOSIS — L570 Actinic keratosis: Secondary | ICD-10-CM | POA: Diagnosis not present

## 2022-02-21 DIAGNOSIS — D225 Melanocytic nevi of trunk: Secondary | ICD-10-CM | POA: Diagnosis not present

## 2022-02-22 DIAGNOSIS — E039 Hypothyroidism, unspecified: Secondary | ICD-10-CM | POA: Diagnosis not present

## 2022-02-22 DIAGNOSIS — N951 Menopausal and female climacteric states: Secondary | ICD-10-CM | POA: Diagnosis not present

## 2022-02-22 DIAGNOSIS — E785 Hyperlipidemia, unspecified: Secondary | ICD-10-CM | POA: Diagnosis not present

## 2022-02-22 DIAGNOSIS — R5383 Other fatigue: Secondary | ICD-10-CM | POA: Diagnosis not present

## 2022-03-02 DIAGNOSIS — E739 Lactose intolerance, unspecified: Secondary | ICD-10-CM | POA: Diagnosis not present

## 2022-03-02 DIAGNOSIS — R079 Chest pain, unspecified: Secondary | ICD-10-CM | POA: Diagnosis not present

## 2022-03-02 DIAGNOSIS — K219 Gastro-esophageal reflux disease without esophagitis: Secondary | ICD-10-CM | POA: Diagnosis not present

## 2022-03-02 DIAGNOSIS — K900 Celiac disease: Secondary | ICD-10-CM | POA: Diagnosis not present

## 2022-03-07 DIAGNOSIS — Z6834 Body mass index (BMI) 34.0-34.9, adult: Secondary | ICD-10-CM | POA: Diagnosis not present

## 2022-03-07 DIAGNOSIS — Z713 Dietary counseling and surveillance: Secondary | ICD-10-CM | POA: Diagnosis not present

## 2022-03-08 DIAGNOSIS — M9903 Segmental and somatic dysfunction of lumbar region: Secondary | ICD-10-CM | POA: Diagnosis not present

## 2022-03-08 DIAGNOSIS — M9901 Segmental and somatic dysfunction of cervical region: Secondary | ICD-10-CM | POA: Diagnosis not present

## 2022-03-08 DIAGNOSIS — M5411 Radiculopathy, occipito-atlanto-axial region: Secondary | ICD-10-CM | POA: Diagnosis not present

## 2022-05-19 ENCOUNTER — Ambulatory Visit: Payer: BC Managed Care – PPO | Admitting: Internal Medicine

## 2022-11-14 ENCOUNTER — Ambulatory Visit: Payer: BC Managed Care – PPO | Admitting: Internal Medicine

## 2023-04-09 ENCOUNTER — Ambulatory Visit (INDEPENDENT_AMBULATORY_CARE_PROVIDER_SITE_OTHER): Payer: BC Managed Care – PPO | Admitting: Internal Medicine

## 2023-04-09 DIAGNOSIS — E039 Hypothyroidism, unspecified: Secondary | ICD-10-CM

## 2023-04-09 NOTE — Progress Notes (Unsigned)
Name: Katie Carney  MRN/ DOB: [id number], April 27, 1965    Age/ Sex: 57 y.o., female    PCP: Jettie Pagan, NP   Reason for Endocrinology Evaluation: Hypothyroidism     Date of Initial Endocrinology Evaluation: 12/29/2021    HPI: Ms. Katie Carney is a 57 y.o. female with a past medical history of hypothyroidism and osteopenia . The patient presented for initial endocrinology clinic visit on 12/29/2021 for consultative assistance with her Hypothyroidism    Patient has been following up with [facility] for many years.  In 2010 the patient was going through perimenopause as well as social circumstances and was feeling irritable and not herself, and was started on Armour Thyroid at the time with a TSH of 4.9 u IU/mL.  She was subsequently started on compounded hormones and she was on this from 2015 until the summer off 2022  She was briefly seen by Dr. Karilyn Cota and 2020 and he continued her Armour Thyroid as well as by identical hormones  The patient has been following up with [facility], Armour Thyroid was increased from 45 to 60 mg, without changes in the way that she feels   She also follows with [facility] (Dr. Vincente Poli), she was getting compounded hormones through them, but when she stopped it in the summer 2022 due to urinary discoloration requiring further investigation   She is currently on herbal supplements instead of hormone replacement therapy  Per patient she has been tested twice for thyroid antibodies with negative results  Of note the patient has had cardiology evaluation for chronic palpitations   No Fh of thyroid disease   SUBJECTIVE:    Today (04/09/23):  Katie Carney is here for follow-up on hypothyroidism. The patient has not been to our clinic in 14 months.   She recently had labs through [facility] with an elevated TSH of 7.830 uIU/mL   She used  to be on Armour Thyroid  but in the summer, 2024  she started  on thyro-dyne  Sees Intergative provider in [place] Dr. Jeanann Lewandowsky though [facility]   She has been noted with weight gain Denies fatigue  Denies constipation  Denies depression but has been grieving   She is on Dyro-Dyne 2 tabs daily     HISTORY:  Past Medical History:  Past Medical History:  Diagnosis Date   Hypothyroidism, adult 04/09/2019   Osteopenia after menopause 04/09/2019   Past Surgical History:  Past Surgical History:  Procedure Laterality Date   GUM SURGERY  2014   WISDOM TOOTH EXTRACTION      Social History:  reports that she has quit smoking. She has never used smokeless tobacco. She reports current alcohol use of about 1.0 - 2.0 standard drink of alcohol per week. She reports that she does not use drugs. Family History: family history includes Dementia in her maternal aunt.   HOME MEDICATIONS: Allergies as of 04/09/2023       Reactions   Penicillins         Medication List        Accurate as of April 09, 2023 11:09 AM. If you have any questions, ask your nurse or doctor.          Biest/Progesterone Crea Place onto the skin.   multivitamin tablet Take 1 tablet by mouth.   thyroid 60 MG tablet Commonly known as: ARMOUR Take 60 mg by mouth daily before breakfast.  REVIEW OF SYSTEMS: A comprehensive ROS was conducted with the patient and is negative except as per HPI     OBJECTIVE:  VS: LMP 10/04/2011    Wt Readings from Last 3 Encounters:  12/29/21 194 lb 12.8 oz (88.4 kg)  12/10/19 182 lb (82.6 kg)  12/11/19 174 lb 9.6 oz (79.2 kg)     EXAM: General: Pt appears well and is in NAD  Eyes: External eye exam normal without stare, lid lag or exophthalmos.  EOM intact.  .  Neck: General: Supple without adenopathy. Thyroid: Thyroid size normal.  No goiter or nodules appreciated. No thyroid bruit.  Lungs: Clear with good BS bilat with no rales, rhonchi, or wheezes  Heart: Auscultation: RRR.  Abdomen:  Normoactive bowel sounds, soft, nontender, without masses or organomegaly palpable  Extremities:  BL LE: No pretibial edema normal ROM and strength.  Mental Status: Judgment, insight: Intact Orientation: Oriented to time, place, and person Mood and affect: No depression, anxiety, or agitation     DATA REVIEWED: Labs @  [facility]   03/23/2023 TSH 7.830 uIU/mL   Thyroid Ultrasound 11/20/2018 No significant thyroid abnormality by ultrasound. Subcentimeter right mid thyroid nodule noted as above.   ASSESSMENT/PLAN/RECOMMENDATIONS:   Hypothyroidism:  The patient continues to follow-up with integrative health provider, she is on thyro-dyne and not on Armour Thyroid anymore.  She did have multiple questions regarding thyro-dyne but I did explain to the patient this is beyond my scope and I am not familiar with this product but that her last TSH was elevated which indicates that she needs levothyroxine.  Patient states that her free T4 and T3 have been checked by her integrative provider and they are within normal range We went over symptoms of hypothyroidism and what she should look for  I will cancel this visit because I am unable to help the patient and she needs to direct her questions to her integrative provider  Signed electronically by: Lyndle Herrlich, MD  [facility] Endocrinology  [facility] [address] [place] [place], [place] [address] Phone: [phone] FAX: [phone]   CC: Jettie Pagan, NP [address] [place] [address] Phone: [phone] Fax: [phone]   Return to Endocrinology clinic as below: Future Appointments  Date Time Provider Department Center  04/09/2023  1:40 PM Hezekiah Veltre, Konrad Dolores, MD [facility] None

## 2023-08-14 ENCOUNTER — Other Ambulatory Visit: Payer: Self-pay | Admitting: Gastroenterology

## 2023-08-14 DIAGNOSIS — R1310 Dysphagia, unspecified: Secondary | ICD-10-CM

## 2024-01-28 ENCOUNTER — Other Ambulatory Visit: Payer: Self-pay | Admitting: Obstetrics and Gynecology

## 2024-01-28 DIAGNOSIS — R928 Other abnormal and inconclusive findings on diagnostic imaging of breast: Secondary | ICD-10-CM

## 2024-02-01 ENCOUNTER — Other Ambulatory Visit: Payer: Self-pay | Admitting: Obstetrics and Gynecology

## 2024-02-01 ENCOUNTER — Ambulatory Visit
Admission: RE | Admit: 2024-02-01 | Discharge: 2024-02-01 | Disposition: A | Discharge status: Home / Self Care | Source: Ambulatory Visit | Attending: Obstetrics and Gynecology | Admitting: Obstetrics and Gynecology

## 2024-02-01 ENCOUNTER — Ambulatory Visit
Admission: RE | Admit: 2024-02-01 | Discharge: 2024-02-01 | Disposition: A | Source: Ambulatory Visit | Attending: Obstetrics and Gynecology | Admitting: Obstetrics and Gynecology

## 2024-02-01 DIAGNOSIS — R928 Other abnormal and inconclusive findings on diagnostic imaging of breast: Secondary | ICD-10-CM

## 2024-02-06 ENCOUNTER — Encounter

## 2024-02-06 ENCOUNTER — Other Ambulatory Visit

## 2024-02-07 ENCOUNTER — Other Ambulatory Visit

## 2024-02-12 ENCOUNTER — Ambulatory Visit
Admission: RE | Admit: 2024-02-12 | Discharge: 2024-02-12 | Disposition: A | Discharge status: Home / Self Care | Source: Ambulatory Visit | Attending: Obstetrics and Gynecology | Admitting: Obstetrics and Gynecology

## 2024-02-12 DIAGNOSIS — R928 Other abnormal and inconclusive findings on diagnostic imaging of breast: Secondary | ICD-10-CM

## 2024-02-12 HISTORY — PX: BREAST BIOPSY: SHX20

## 2024-02-13 LAB — SURGICAL PATHOLOGY

## 2024-02-15 ENCOUNTER — Other Ambulatory Visit
